# Patient Record
Sex: Female | Born: 1950 | Race: Black or African American | Hispanic: No | Marital: Single | Smoking: Never smoker
Health system: Southern US, Community
[De-identification: ages and names within clinical notes are randomized; demographics above are authoritative.]

## PROBLEM LIST (undated history)

## (undated) DIAGNOSIS — K432 Incisional hernia without obstruction or gangrene: Secondary | ICD-10-CM

## (undated) DIAGNOSIS — K449 Diaphragmatic hernia without obstruction or gangrene: Secondary | ICD-10-CM

## (undated) DIAGNOSIS — I10 Essential (primary) hypertension: Secondary | ICD-10-CM

## (undated) HISTORY — PX: ABDOMINAL HYSTERECTOMY: SHX81

## (undated) HISTORY — PX: TOTAL HIP ARTHROPLASTY: SHX124

## (undated) HISTORY — PX: COLON SURGERY: SHX602

## (undated) HISTORY — PX: BILATERAL OOPHORECTOMY: SHX1221

## (undated) HISTORY — PX: DEBRIDEMENT AND CLOSURE WOUND: SHX5614

## (undated) HISTORY — PX: HERNIA REPAIR: SHX51

## (undated) HISTORY — PX: OTHER SURGICAL HISTORY: SHX169

## (undated) HISTORY — PX: PARTIAL COLECTOMY: SHX5273

## (undated) HISTORY — PX: SPINE SURGERY: SHX786

## (undated) HISTORY — PX: CHOLECYSTECTOMY: SHX55

---

## 2002-08-19 LAB — HM HEPATITIS C SCREENING LAB: HM Hepatitis Screen: NEGATIVE

## 2008-03-09 ENCOUNTER — Ambulatory Visit: Payer: Self-pay | Admitting: Internal Medicine

## 2008-11-17 ENCOUNTER — Ambulatory Visit: Payer: Self-pay | Admitting: General Surgery

## 2008-11-20 ENCOUNTER — Ambulatory Visit: Payer: Self-pay | Admitting: General Surgery

## 2008-12-10 ENCOUNTER — Emergency Department: Payer: Self-pay | Admitting: Emergency Medicine

## 2008-12-12 ENCOUNTER — Ambulatory Visit: Payer: Self-pay | Admitting: General Surgery

## 2008-12-14 ENCOUNTER — Ambulatory Visit: Payer: Self-pay | Admitting: General Surgery

## 2008-12-18 ENCOUNTER — Ambulatory Visit: Payer: Self-pay | Admitting: General Surgery

## 2009-03-27 ENCOUNTER — Ambulatory Visit: Payer: Self-pay | Admitting: Internal Medicine

## 2009-06-06 ENCOUNTER — Ambulatory Visit: Payer: Self-pay

## 2010-02-14 ENCOUNTER — Ambulatory Visit: Payer: Self-pay | Admitting: Internal Medicine

## 2010-04-01 ENCOUNTER — Ambulatory Visit: Payer: Self-pay | Admitting: Internal Medicine

## 2011-04-11 ENCOUNTER — Ambulatory Visit: Payer: Self-pay | Admitting: Internal Medicine

## 2012-04-27 ENCOUNTER — Ambulatory Visit: Payer: Self-pay | Admitting: Internal Medicine

## 2012-05-11 ENCOUNTER — Ambulatory Visit: Payer: Self-pay | Admitting: Internal Medicine

## 2012-07-08 DIAGNOSIS — Z905 Acquired absence of kidney: Secondary | ICD-10-CM | POA: Insufficient documentation

## 2012-12-23 ENCOUNTER — Ambulatory Visit: Payer: Self-pay | Admitting: Orthopedic Surgery

## 2013-10-24 ENCOUNTER — Ambulatory Visit: Payer: Self-pay

## 2013-10-28 ENCOUNTER — Ambulatory Visit: Payer: Self-pay

## 2014-04-28 ENCOUNTER — Ambulatory Visit: Payer: Self-pay

## 2014-08-16 ENCOUNTER — Ambulatory Visit: Payer: Self-pay | Admitting: General Practice

## 2014-09-27 ENCOUNTER — Emergency Department: Payer: Self-pay | Admitting: Emergency Medicine

## 2014-09-27 LAB — URINALYSIS, COMPLETE
BLOOD: NEGATIVE
Bacteria: NONE SEEN
Bilirubin,UR: NEGATIVE
Glucose,UR: NEGATIVE mg/dL (ref 0–75)
Ketone: NEGATIVE
NITRITE: NEGATIVE
PROTEIN: NEGATIVE
Ph: 5 (ref 4.5–8.0)
RBC,UR: 1 /HPF (ref 0–5)
Specific Gravity: 1.018 (ref 1.003–1.030)
WBC UR: 5 /HPF (ref 0–5)

## 2014-09-27 LAB — COMPREHENSIVE METABOLIC PANEL
ALK PHOS: 90 U/L
ANION GAP: 6 — AB (ref 7–16)
Albumin: 3.6 g/dL (ref 3.4–5.0)
BUN: 14 mg/dL (ref 7–18)
Bilirubin,Total: 0.5 mg/dL (ref 0.2–1.0)
CALCIUM: 9 mg/dL (ref 8.5–10.1)
CO2: 27 mmol/L (ref 21–32)
CREATININE: 1.13 mg/dL (ref 0.60–1.30)
Chloride: 108 mmol/L — ABNORMAL HIGH (ref 98–107)
EGFR (Non-African Amer.): 51 — ABNORMAL LOW
Glucose: 87 mg/dL (ref 65–99)
Osmolality: 281 (ref 275–301)
POTASSIUM: 3.9 mmol/L (ref 3.5–5.1)
SGOT(AST): 26 U/L (ref 15–37)
SGPT (ALT): 29 U/L
SODIUM: 141 mmol/L (ref 136–145)
Total Protein: 7.6 g/dL (ref 6.4–8.2)

## 2014-09-27 LAB — CBC WITH DIFFERENTIAL/PLATELET
BASOS PCT: 0.5 %
Basophil #: 0 10*3/uL (ref 0.0–0.1)
EOS PCT: 1.8 %
Eosinophil #: 0.2 10*3/uL (ref 0.0–0.7)
HCT: 41.1 % (ref 35.0–47.0)
HGB: 13.6 g/dL (ref 12.0–16.0)
Lymphocyte #: 2.7 10*3/uL (ref 1.0–3.6)
Lymphocyte %: 30.5 %
MCH: 30.8 pg (ref 26.0–34.0)
MCHC: 33 g/dL (ref 32.0–36.0)
MCV: 93 fL (ref 80–100)
Monocyte #: 0.6 x10 3/mm (ref 0.2–0.9)
Monocyte %: 6.8 %
NEUTROS ABS: 5.3 10*3/uL (ref 1.4–6.5)
Neutrophil %: 60.4 %
Platelet: 210 10*3/uL (ref 150–440)
RBC: 4.41 10*6/uL (ref 3.80–5.20)
RDW: 13.5 % (ref 11.5–14.5)
WBC: 8.8 10*3/uL (ref 3.6–11.0)

## 2014-09-27 LAB — LIPASE, BLOOD: LIPASE: 147 U/L (ref 73–393)

## 2014-09-27 LAB — TROPONIN I

## 2015-02-07 ENCOUNTER — Inpatient Hospital Stay (HOSPITAL_COMMUNITY): Admission: RE | Admit: 2015-02-07 | Payer: Self-pay | Source: Ambulatory Visit | Admitting: Orthopedic Surgery

## 2015-02-07 ENCOUNTER — Encounter (HOSPITAL_COMMUNITY): Admission: RE | Payer: Self-pay | Source: Ambulatory Visit

## 2015-02-07 ENCOUNTER — Ambulatory Visit
Admit: 2015-02-07 | Disposition: A | Discharge status: Home / Self Care | Payer: Self-pay | Attending: Internal Medicine | Admitting: Internal Medicine

## 2015-02-07 SURGERY — TOTAL HIP REVISION
Anesthesia: Choice | Site: Hip | Laterality: Left

## 2015-09-21 ENCOUNTER — Ambulatory Visit: Admit: 2015-09-21 | Payer: Self-pay | Admitting: Gastroenterology

## 2015-09-21 SURGERY — COLONOSCOPY WITH PROPOFOL
Anesthesia: General

## 2016-03-18 DIAGNOSIS — Z79899 Other long term (current) drug therapy: Secondary | ICD-10-CM | POA: Diagnosis not present

## 2016-03-18 DIAGNOSIS — Z1329 Encounter for screening for other suspected endocrine disorder: Secondary | ICD-10-CM | POA: Diagnosis not present

## 2016-03-18 DIAGNOSIS — E782 Mixed hyperlipidemia: Secondary | ICD-10-CM | POA: Diagnosis not present

## 2016-03-18 DIAGNOSIS — R7309 Other abnormal glucose: Secondary | ICD-10-CM | POA: Diagnosis not present

## 2016-03-18 DIAGNOSIS — I10 Essential (primary) hypertension: Secondary | ICD-10-CM | POA: Diagnosis not present

## 2016-04-03 DIAGNOSIS — N182 Chronic kidney disease, stage 2 (mild): Secondary | ICD-10-CM | POA: Diagnosis not present

## 2016-04-03 DIAGNOSIS — I10 Essential (primary) hypertension: Secondary | ICD-10-CM | POA: Diagnosis not present

## 2016-04-03 DIAGNOSIS — Z1239 Encounter for other screening for malignant neoplasm of breast: Secondary | ICD-10-CM | POA: Diagnosis not present

## 2016-04-03 DIAGNOSIS — Z79899 Other long term (current) drug therapy: Secondary | ICD-10-CM | POA: Diagnosis not present

## 2016-04-03 DIAGNOSIS — E782 Mixed hyperlipidemia: Secondary | ICD-10-CM | POA: Diagnosis not present

## 2016-04-03 DIAGNOSIS — Z1211 Encounter for screening for malignant neoplasm of colon: Secondary | ICD-10-CM | POA: Diagnosis not present

## 2016-04-09 ENCOUNTER — Other Ambulatory Visit: Payer: Self-pay | Admitting: Internal Medicine

## 2016-04-09 DIAGNOSIS — Z1211 Encounter for screening for malignant neoplasm of colon: Secondary | ICD-10-CM | POA: Diagnosis not present

## 2016-04-09 DIAGNOSIS — Z1231 Encounter for screening mammogram for malignant neoplasm of breast: Secondary | ICD-10-CM

## 2016-04-29 ENCOUNTER — Ambulatory Visit
Admission: RE | Admit: 2016-04-29 | Discharge: 2016-04-29 | Disposition: A | Discharge status: Home / Self Care | Payer: PPO | Source: Ambulatory Visit | Attending: Internal Medicine | Admitting: Internal Medicine

## 2016-04-29 ENCOUNTER — Other Ambulatory Visit: Payer: Self-pay | Admitting: Internal Medicine

## 2016-04-29 DIAGNOSIS — Z1231 Encounter for screening mammogram for malignant neoplasm of breast: Secondary | ICD-10-CM

## 2016-09-24 DIAGNOSIS — Z79899 Other long term (current) drug therapy: Secondary | ICD-10-CM | POA: Diagnosis not present

## 2016-09-24 DIAGNOSIS — E782 Mixed hyperlipidemia: Secondary | ICD-10-CM | POA: Diagnosis not present

## 2016-09-24 DIAGNOSIS — I10 Essential (primary) hypertension: Secondary | ICD-10-CM | POA: Diagnosis not present

## 2016-10-01 DIAGNOSIS — N182 Chronic kidney disease, stage 2 (mild): Secondary | ICD-10-CM | POA: Diagnosis not present

## 2016-10-01 DIAGNOSIS — E782 Mixed hyperlipidemia: Secondary | ICD-10-CM | POA: Diagnosis not present

## 2016-10-01 DIAGNOSIS — Z79899 Other long term (current) drug therapy: Secondary | ICD-10-CM | POA: Diagnosis not present

## 2016-10-01 DIAGNOSIS — Z0000 Encounter for general adult medical examination without abnormal findings: Secondary | ICD-10-CM | POA: Diagnosis not present

## 2016-10-01 DIAGNOSIS — I10 Essential (primary) hypertension: Secondary | ICD-10-CM | POA: Diagnosis not present

## 2017-01-21 IMAGING — MG MM DIGITAL SCREENING BILAT W/ TOMO W/ CAD
8 of 13 series · 8 of 29 positions shown · non-contrast
Comparison: Previous exam(s).

CLINICAL DATA: Screening.

EXAM:
2D DIGITAL SCREENING BILATERAL MAMMOGRAM WITH CAD AND ADJUNCT TOMO

[R MLO (1 of 2)]
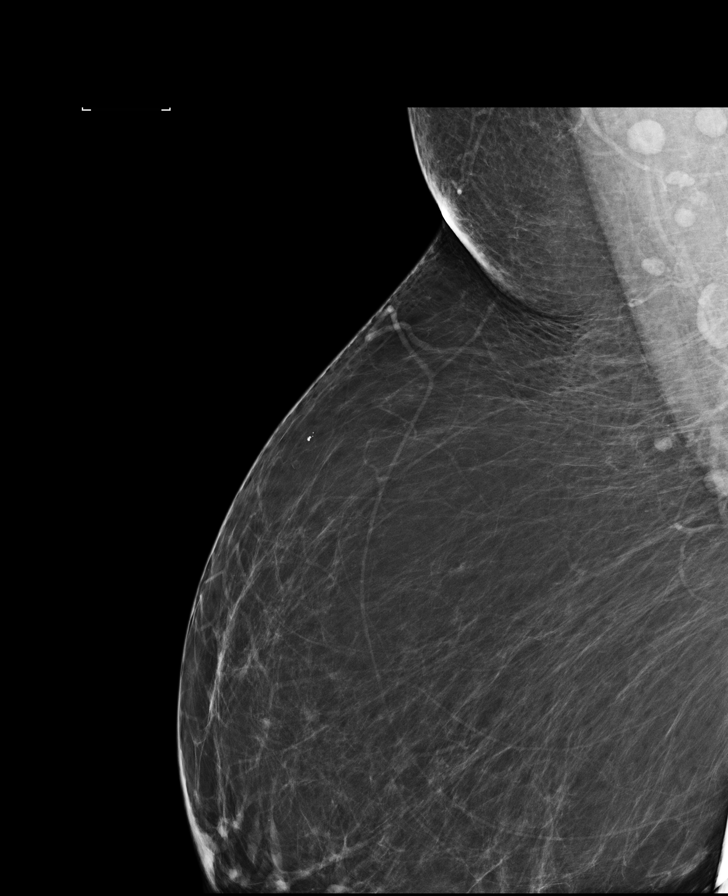

[R CC]
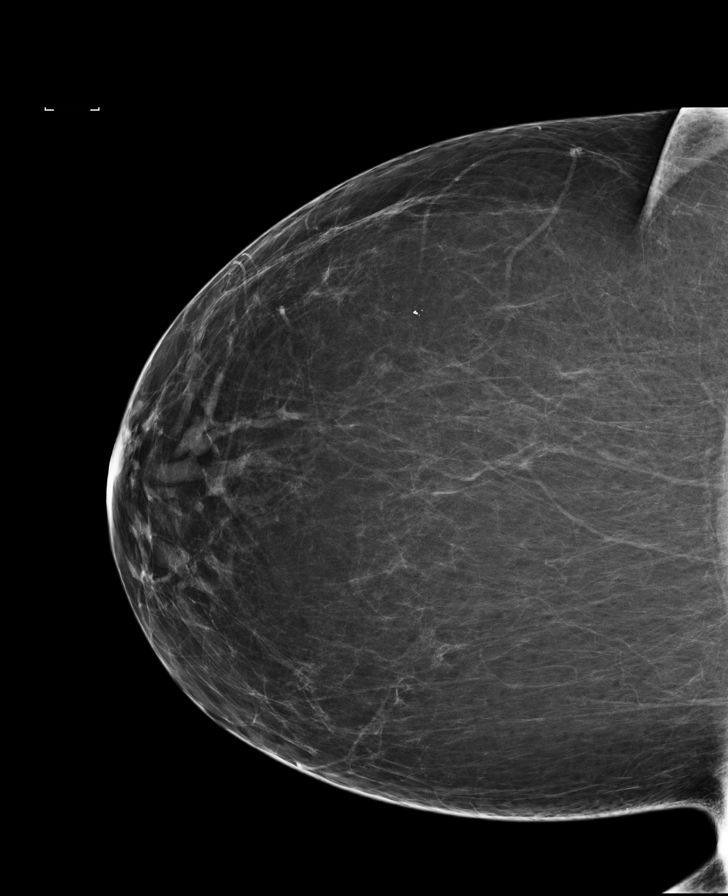

[L CC synth-2D]
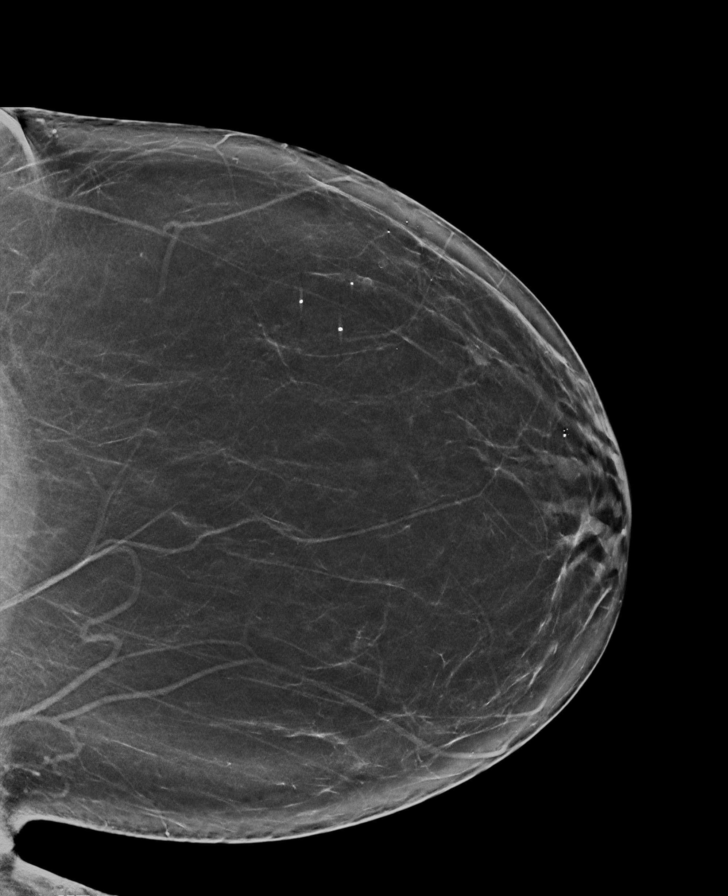

[R MLO (2 of 2)]
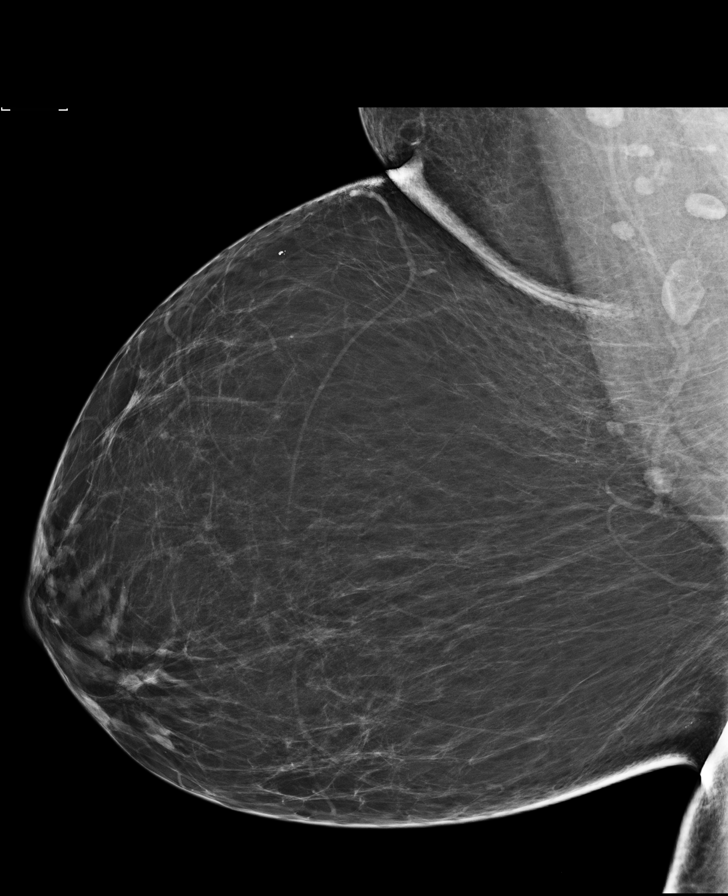

[R MLO synth-2D]
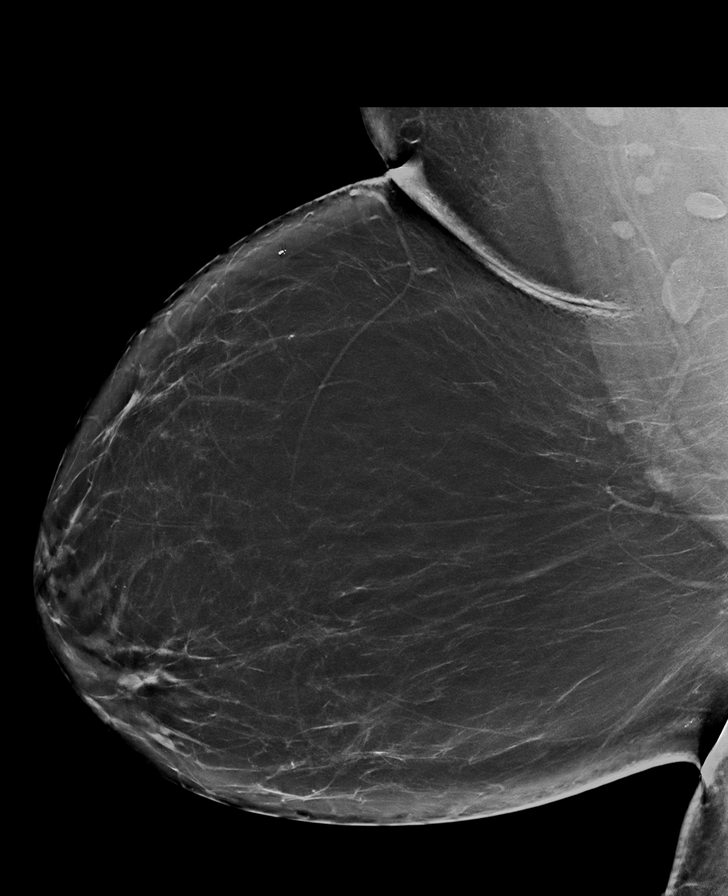

[L MLO synth-2D]
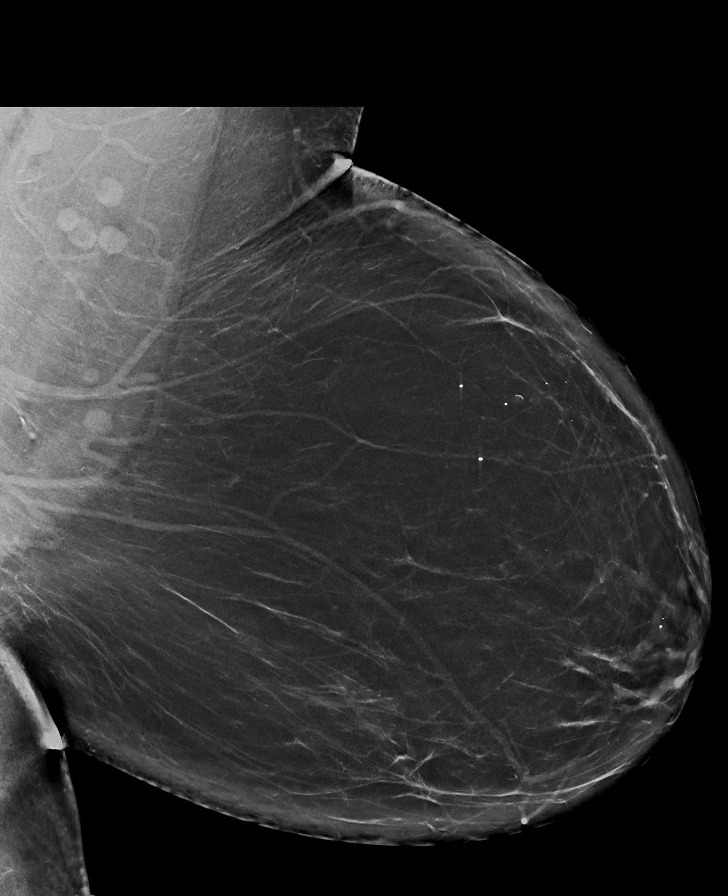

[R CC synth-2D]
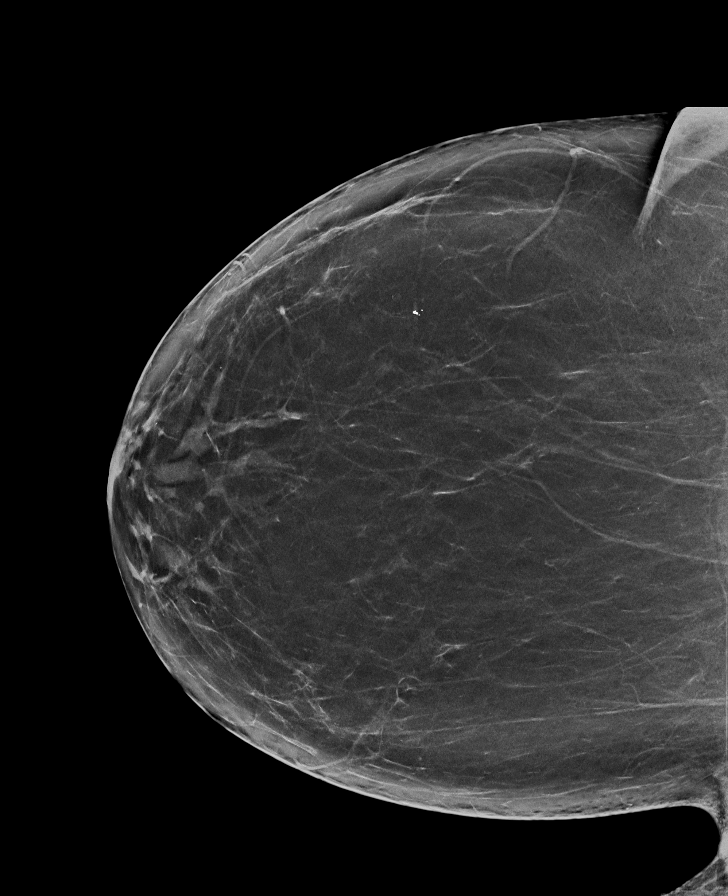

[L CC]
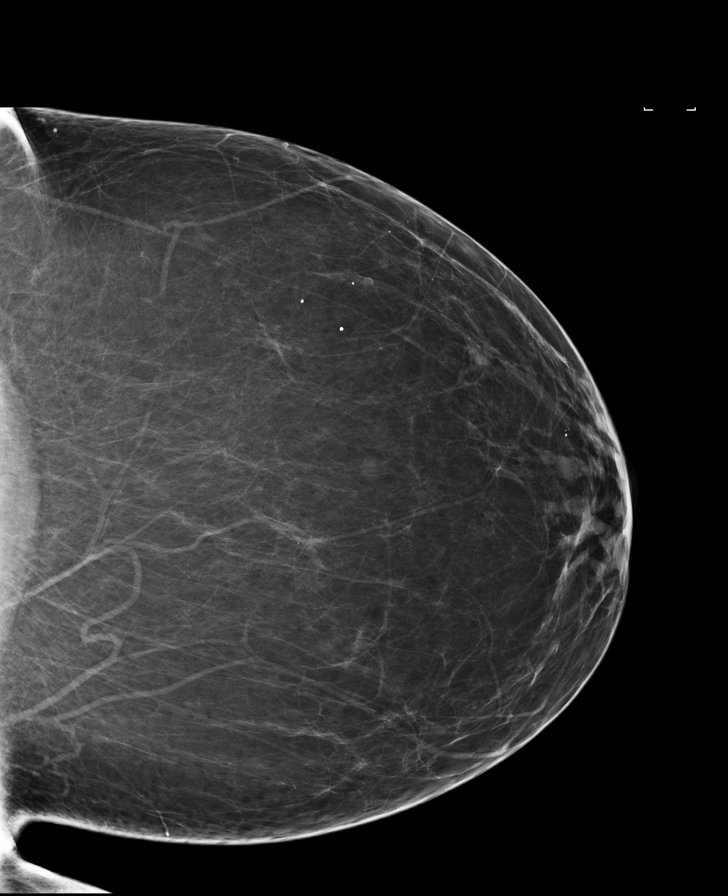

[8 of 29 positions shown; findings below may reference images not displayed]

ACR Breast Density Category b: There are scattered areas of
fibroglandular density.
FINDINGS: There are no findings suspicious for malignancy. Images were
processed with CAD.
IMPRESSION: No mammographic evidence of malignancy. A result letter of this
screening mammogram will be mailed directly to the patient.

RECOMMENDATION:
Screening mammogram in one year. (Code:97-6-RS4)

BI-RADS CATEGORY  1: Negative.

## 2017-03-23 DIAGNOSIS — I10 Essential (primary) hypertension: Secondary | ICD-10-CM | POA: Diagnosis not present

## 2017-03-23 DIAGNOSIS — Z79899 Other long term (current) drug therapy: Secondary | ICD-10-CM | POA: Diagnosis not present

## 2017-03-23 DIAGNOSIS — E782 Mixed hyperlipidemia: Secondary | ICD-10-CM | POA: Diagnosis not present

## 2017-03-25 DIAGNOSIS — M25552 Pain in left hip: Secondary | ICD-10-CM | POA: Diagnosis not present

## 2017-03-25 DIAGNOSIS — Z1231 Encounter for screening mammogram for malignant neoplasm of breast: Secondary | ICD-10-CM | POA: Diagnosis not present

## 2017-03-25 DIAGNOSIS — E782 Mixed hyperlipidemia: Secondary | ICD-10-CM | POA: Diagnosis not present

## 2017-03-25 DIAGNOSIS — N182 Chronic kidney disease, stage 2 (mild): Secondary | ICD-10-CM | POA: Diagnosis not present

## 2017-03-25 DIAGNOSIS — R002 Palpitations: Secondary | ICD-10-CM | POA: Diagnosis not present

## 2017-03-25 DIAGNOSIS — Z0000 Encounter for general adult medical examination without abnormal findings: Secondary | ICD-10-CM | POA: Diagnosis not present

## 2017-03-25 DIAGNOSIS — R079 Chest pain, unspecified: Secondary | ICD-10-CM | POA: Diagnosis not present

## 2017-03-25 DIAGNOSIS — I10 Essential (primary) hypertension: Secondary | ICD-10-CM | POA: Diagnosis not present

## 2017-03-30 ENCOUNTER — Other Ambulatory Visit: Payer: Self-pay | Admitting: Internal Medicine

## 2017-03-30 DIAGNOSIS — R079 Chest pain, unspecified: Secondary | ICD-10-CM | POA: Diagnosis not present

## 2017-03-30 DIAGNOSIS — R002 Palpitations: Secondary | ICD-10-CM | POA: Diagnosis not present

## 2017-03-30 DIAGNOSIS — Z1231 Encounter for screening mammogram for malignant neoplasm of breast: Secondary | ICD-10-CM | POA: Diagnosis not present

## 2017-03-30 DIAGNOSIS — M25552 Pain in left hip: Secondary | ICD-10-CM | POA: Diagnosis not present

## 2017-03-30 DIAGNOSIS — E782 Mixed hyperlipidemia: Secondary | ICD-10-CM | POA: Diagnosis not present

## 2017-03-30 DIAGNOSIS — I10 Essential (primary) hypertension: Secondary | ICD-10-CM | POA: Diagnosis not present

## 2017-03-30 DIAGNOSIS — N182 Chronic kidney disease, stage 2 (mild): Secondary | ICD-10-CM | POA: Diagnosis not present

## 2017-03-30 DIAGNOSIS — Z0000 Encounter for general adult medical examination without abnormal findings: Secondary | ICD-10-CM | POA: Diagnosis not present

## 2017-04-06 DIAGNOSIS — R002 Palpitations: Secondary | ICD-10-CM | POA: Diagnosis not present

## 2017-05-05 ENCOUNTER — Ambulatory Visit
Admission: RE | Admit: 2017-05-05 | Discharge: 2017-05-05 | Disposition: A | Discharge status: Home / Self Care | Payer: PPO | Source: Ambulatory Visit | Attending: Internal Medicine | Admitting: Internal Medicine

## 2017-05-05 DIAGNOSIS — Z1231 Encounter for screening mammogram for malignant neoplasm of breast: Secondary | ICD-10-CM | POA: Diagnosis not present

## 2017-07-24 DIAGNOSIS — G8929 Other chronic pain: Secondary | ICD-10-CM | POA: Diagnosis not present

## 2017-07-24 DIAGNOSIS — I10 Essential (primary) hypertension: Secondary | ICD-10-CM | POA: Diagnosis not present

## 2017-07-24 DIAGNOSIS — M25552 Pain in left hip: Secondary | ICD-10-CM | POA: Diagnosis not present

## 2017-07-24 DIAGNOSIS — E782 Mixed hyperlipidemia: Secondary | ICD-10-CM | POA: Diagnosis not present

## 2017-07-24 DIAGNOSIS — N182 Chronic kidney disease, stage 2 (mild): Secondary | ICD-10-CM | POA: Diagnosis not present

## 2017-07-24 DIAGNOSIS — Z1211 Encounter for screening for malignant neoplasm of colon: Secondary | ICD-10-CM | POA: Diagnosis not present

## 2017-07-24 DIAGNOSIS — Z79899 Other long term (current) drug therapy: Secondary | ICD-10-CM | POA: Diagnosis not present

## 2017-08-04 DIAGNOSIS — Z96642 Presence of left artificial hip joint: Secondary | ICD-10-CM | POA: Diagnosis not present

## 2017-08-04 DIAGNOSIS — M25552 Pain in left hip: Secondary | ICD-10-CM | POA: Diagnosis not present

## 2017-08-06 DIAGNOSIS — M25552 Pain in left hip: Secondary | ICD-10-CM | POA: Diagnosis not present

## 2017-08-06 DIAGNOSIS — Z1211 Encounter for screening for malignant neoplasm of colon: Secondary | ICD-10-CM | POA: Diagnosis not present

## 2017-08-06 DIAGNOSIS — Z96642 Presence of left artificial hip joint: Secondary | ICD-10-CM | POA: Diagnosis not present

## 2017-08-12 DIAGNOSIS — Z96642 Presence of left artificial hip joint: Secondary | ICD-10-CM | POA: Diagnosis not present

## 2017-08-12 DIAGNOSIS — Z01818 Encounter for other preprocedural examination: Secondary | ICD-10-CM | POA: Diagnosis not present

## 2017-08-12 DIAGNOSIS — Z01812 Encounter for preprocedural laboratory examination: Secondary | ICD-10-CM | POA: Diagnosis not present

## 2017-08-12 DIAGNOSIS — M25552 Pain in left hip: Secondary | ICD-10-CM | POA: Diagnosis not present

## 2017-08-14 DIAGNOSIS — E782 Mixed hyperlipidemia: Secondary | ICD-10-CM | POA: Diagnosis not present

## 2017-08-14 DIAGNOSIS — I10 Essential (primary) hypertension: Secondary | ICD-10-CM | POA: Diagnosis not present

## 2017-08-14 DIAGNOSIS — N182 Chronic kidney disease, stage 2 (mild): Secondary | ICD-10-CM | POA: Diagnosis not present

## 2017-08-17 DIAGNOSIS — G253 Myoclonus: Secondary | ICD-10-CM | POA: Diagnosis not present

## 2017-08-17 DIAGNOSIS — K5909 Other constipation: Secondary | ICD-10-CM | POA: Diagnosis not present

## 2017-08-17 DIAGNOSIS — R109 Unspecified abdominal pain: Secondary | ICD-10-CM | POA: Diagnosis not present

## 2017-08-17 DIAGNOSIS — M6250 Muscle wasting and atrophy, not elsewhere classified, unspecified site: Secondary | ICD-10-CM | POA: Diagnosis not present

## 2017-08-17 DIAGNOSIS — M25552 Pain in left hip: Secondary | ICD-10-CM | POA: Diagnosis not present

## 2017-08-17 DIAGNOSIS — Q600 Renal agenesis, unilateral: Secondary | ICD-10-CM | POA: Diagnosis not present

## 2017-08-17 DIAGNOSIS — R531 Weakness: Secondary | ICD-10-CM | POA: Diagnosis not present

## 2017-08-17 DIAGNOSIS — F17200 Nicotine dependence, unspecified, uncomplicated: Secondary | ICD-10-CM | POA: Diagnosis not present

## 2017-08-17 DIAGNOSIS — M6281 Muscle weakness (generalized): Secondary | ICD-10-CM | POA: Diagnosis not present

## 2017-08-17 DIAGNOSIS — N179 Acute kidney failure, unspecified: Secondary | ICD-10-CM | POA: Diagnosis not present

## 2017-08-17 DIAGNOSIS — E876 Hypokalemia: Secondary | ICD-10-CM | POA: Diagnosis not present

## 2017-08-17 DIAGNOSIS — T84061A Wear of articular bearing surface of internal prosthetic left hip joint, initial encounter: Secondary | ICD-10-CM | POA: Diagnosis not present

## 2017-08-17 DIAGNOSIS — M14852 Arthropathies in other specified diseases classified elsewhere, left hip: Secondary | ICD-10-CM | POA: Diagnosis not present

## 2017-08-17 DIAGNOSIS — Z96642 Presence of left artificial hip joint: Secondary | ICD-10-CM | POA: Diagnosis not present

## 2017-08-17 DIAGNOSIS — R110 Nausea: Secondary | ICD-10-CM | POA: Diagnosis not present

## 2017-08-17 DIAGNOSIS — N189 Chronic kidney disease, unspecified: Secondary | ICD-10-CM | POA: Diagnosis not present

## 2017-08-17 DIAGNOSIS — R51 Headache: Secondary | ICD-10-CM | POA: Diagnosis not present

## 2017-08-17 DIAGNOSIS — R2689 Other abnormalities of gait and mobility: Secondary | ICD-10-CM | POA: Diagnosis not present

## 2017-08-17 DIAGNOSIS — T8484XA Pain due to internal orthopedic prosthetic devices, implants and grafts, initial encounter: Secondary | ICD-10-CM | POA: Diagnosis not present

## 2017-08-17 DIAGNOSIS — G8911 Acute pain due to trauma: Secondary | ICD-10-CM | POA: Diagnosis not present

## 2017-08-20 DIAGNOSIS — Z96642 Presence of left artificial hip joint: Secondary | ICD-10-CM | POA: Diagnosis not present

## 2017-08-20 DIAGNOSIS — K5909 Other constipation: Secondary | ICD-10-CM | POA: Diagnosis not present

## 2017-08-20 DIAGNOSIS — R262 Difficulty in walking, not elsewhere classified: Secondary | ICD-10-CM | POA: Diagnosis not present

## 2017-08-20 DIAGNOSIS — K5900 Constipation, unspecified: Secondary | ICD-10-CM | POA: Diagnosis not present

## 2017-08-20 DIAGNOSIS — Z7982 Long term (current) use of aspirin: Secondary | ICD-10-CM | POA: Diagnosis not present

## 2017-08-20 DIAGNOSIS — M6250 Muscle wasting and atrophy, not elsewhere classified, unspecified site: Secondary | ICD-10-CM | POA: Diagnosis not present

## 2017-08-20 DIAGNOSIS — R2689 Other abnormalities of gait and mobility: Secondary | ICD-10-CM | POA: Diagnosis not present

## 2017-08-20 DIAGNOSIS — G8911 Acute pain due to trauma: Secondary | ICD-10-CM | POA: Diagnosis not present

## 2017-08-20 DIAGNOSIS — M6281 Muscle weakness (generalized): Secondary | ICD-10-CM | POA: Diagnosis not present

## 2017-08-20 DIAGNOSIS — D649 Anemia, unspecified: Secondary | ICD-10-CM | POA: Diagnosis not present

## 2017-08-20 DIAGNOSIS — R531 Weakness: Secondary | ICD-10-CM | POA: Diagnosis not present

## 2017-08-20 DIAGNOSIS — M14852 Arthropathies in other specified diseases classified elsewhere, left hip: Secondary | ICD-10-CM | POA: Diagnosis not present

## 2017-08-23 DIAGNOSIS — K5900 Constipation, unspecified: Secondary | ICD-10-CM | POA: Diagnosis not present

## 2017-08-23 DIAGNOSIS — R262 Difficulty in walking, not elsewhere classified: Secondary | ICD-10-CM | POA: Diagnosis not present

## 2017-08-23 DIAGNOSIS — Z96642 Presence of left artificial hip joint: Secondary | ICD-10-CM | POA: Diagnosis not present

## 2017-08-23 DIAGNOSIS — Z7982 Long term (current) use of aspirin: Secondary | ICD-10-CM | POA: Diagnosis not present

## 2017-08-28 DIAGNOSIS — K5900 Constipation, unspecified: Secondary | ICD-10-CM | POA: Diagnosis not present

## 2017-08-28 DIAGNOSIS — Z7982 Long term (current) use of aspirin: Secondary | ICD-10-CM | POA: Diagnosis not present

## 2017-08-28 DIAGNOSIS — D649 Anemia, unspecified: Secondary | ICD-10-CM | POA: Diagnosis not present

## 2017-08-28 DIAGNOSIS — Z96642 Presence of left artificial hip joint: Secondary | ICD-10-CM | POA: Diagnosis not present

## 2017-08-31 ENCOUNTER — Other Ambulatory Visit: Payer: Self-pay | Admitting: *Deleted

## 2017-08-31 DIAGNOSIS — R2689 Other abnormalities of gait and mobility: Secondary | ICD-10-CM | POA: Diagnosis not present

## 2017-08-31 DIAGNOSIS — Z96641 Presence of right artificial hip joint: Secondary | ICD-10-CM | POA: Diagnosis not present

## 2017-08-31 DIAGNOSIS — Z4789 Encounter for other orthopedic aftercare: Secondary | ICD-10-CM | POA: Diagnosis not present

## 2017-08-31 DIAGNOSIS — Z96649 Presence of unspecified artificial hip joint: Secondary | ICD-10-CM | POA: Diagnosis not present

## 2017-08-31 DIAGNOSIS — M25552 Pain in left hip: Secondary | ICD-10-CM | POA: Diagnosis not present

## 2017-08-31 NOTE — Patient Outreach (Signed)
[facility]) Care Management  08/31/2017  Philis KendallMarsha A Messer 09-30-1951 [id number]   Transition of Care Referral  Referral Date: 08/31/17 Referral Source: HTA Date of Discharge: 08/28/17 Facility: [facility] Discharge Diagnosis: N/A Insurance: HTA  Outreach attempt #1 to patient. Patient was unavailable. She requested a return phone call at a later time.   Plan: RN CM will contact patient within one week.   Wynelle ClevelandJuanita Friend Dorfman, RN, BSN, MHA/MSL, [facility] Telephonic Care Manager Coordinator [facility] Direct Phone: [phone] Toll Free: [phone] Fax: [phone]

## 2017-09-01 ENCOUNTER — Other Ambulatory Visit: Payer: Self-pay | Admitting: *Deleted

## 2017-09-01 NOTE — Patient Outreach (Signed)
[facility]) Care Management  09/01/2017  Carmen Washington 06/15/1951 [id number]  Transition of Care Referral  Referral Date: 08/31/17 Referral Source: HTA Date of Discharge: 08/28/17 Facility: [facility] Discharge Diagnosis: Weakness r/t Hip Revision Insurance: HTA  Outreach telephone call to patient. Patient verified HIPAA identifiers. Patient was transitioned from inpatient hospital stay to [facility] in [place] due to a left hip revision. She had an appointment on 08/31/17 with the Orthopedic Surgeon. The Surgeon prescribed outpatient PT for patient at [facility] in [place]. She had an initial visit at [facility] on 08/31/17. Patient stated, she was given a cane during the initial visit. Patient's future appointments will be scheduled 2 times per week (Wednesday & Friday). Patient reported, "She has an appointment scheduled for next month with the Orthopedic Surgeon to determine the need for a right hip revision". [facility] services and benefits explained to patient. Patient is willing to receive EMMI educational materials, [facility] broch[facility] and pamphlet.   Plan: RN CM will notify [facility] CM administrative assistant regarding case closure.  RN CM advised patient to contact RNCM for any needs or concerns. RN CM will send patient EMMI educational materials. RN CM will send patient [facility] brochure and pamphlet. RN CM will send patient case closure letter. RN CM will send MD case closure letter.    Wynelle ClevelandJuanita Gustabo Gordillo, RN, BSN, MHA/MSL, [facility] Telephonic Care Manager Coordinator [facility] Direct Phone: [phone] Toll Free: [phone] Fax: [phone]

## 2017-09-02 ENCOUNTER — Ambulatory Visit: Payer: PPO | Admitting: *Deleted

## 2017-09-02 ENCOUNTER — Encounter: Payer: Self-pay | Admitting: *Deleted

## 2017-09-02 DIAGNOSIS — M25552 Pain in left hip: Secondary | ICD-10-CM | POA: Diagnosis not present

## 2017-09-02 DIAGNOSIS — R2689 Other abnormalities of gait and mobility: Secondary | ICD-10-CM | POA: Diagnosis not present

## 2017-09-02 DIAGNOSIS — Z96642 Presence of left artificial hip joint: Secondary | ICD-10-CM | POA: Diagnosis not present

## 2017-09-09 DIAGNOSIS — Z96642 Presence of left artificial hip joint: Secondary | ICD-10-CM | POA: Diagnosis not present

## 2017-09-09 DIAGNOSIS — R2689 Other abnormalities of gait and mobility: Secondary | ICD-10-CM | POA: Diagnosis not present

## 2017-09-09 DIAGNOSIS — M25552 Pain in left hip: Secondary | ICD-10-CM | POA: Diagnosis not present

## 2017-09-11 DIAGNOSIS — M25552 Pain in left hip: Secondary | ICD-10-CM | POA: Diagnosis not present

## 2017-09-11 DIAGNOSIS — Z96642 Presence of left artificial hip joint: Secondary | ICD-10-CM | POA: Diagnosis not present

## 2017-09-11 DIAGNOSIS — R2689 Other abnormalities of gait and mobility: Secondary | ICD-10-CM | POA: Diagnosis not present

## 2017-09-15 DIAGNOSIS — M25552 Pain in left hip: Secondary | ICD-10-CM | POA: Diagnosis not present

## 2017-09-15 DIAGNOSIS — R2689 Other abnormalities of gait and mobility: Secondary | ICD-10-CM | POA: Diagnosis not present

## 2017-09-15 DIAGNOSIS — Z96642 Presence of left artificial hip joint: Secondary | ICD-10-CM | POA: Diagnosis not present

## 2017-09-18 DIAGNOSIS — R2689 Other abnormalities of gait and mobility: Secondary | ICD-10-CM | POA: Diagnosis not present

## 2017-09-18 DIAGNOSIS — M25552 Pain in left hip: Secondary | ICD-10-CM | POA: Diagnosis not present

## 2017-09-18 DIAGNOSIS — Z96642 Presence of left artificial hip joint: Secondary | ICD-10-CM | POA: Diagnosis not present

## 2017-09-24 DIAGNOSIS — M25552 Pain in left hip: Secondary | ICD-10-CM | POA: Diagnosis not present

## 2017-09-24 DIAGNOSIS — R2689 Other abnormalities of gait and mobility: Secondary | ICD-10-CM | POA: Diagnosis not present

## 2017-09-24 DIAGNOSIS — Z96642 Presence of left artificial hip joint: Secondary | ICD-10-CM | POA: Diagnosis not present

## 2017-09-29 DIAGNOSIS — R2689 Other abnormalities of gait and mobility: Secondary | ICD-10-CM | POA: Diagnosis not present

## 2017-09-29 DIAGNOSIS — M25552 Pain in left hip: Secondary | ICD-10-CM | POA: Diagnosis not present

## 2017-09-29 DIAGNOSIS — Z96649 Presence of unspecified artificial hip joint: Secondary | ICD-10-CM | POA: Diagnosis not present

## 2017-10-01 DIAGNOSIS — M25551 Pain in right hip: Secondary | ICD-10-CM | POA: Diagnosis not present

## 2017-10-01 DIAGNOSIS — M25552 Pain in left hip: Secondary | ICD-10-CM | POA: Diagnosis not present

## 2017-10-01 DIAGNOSIS — Z96641 Presence of right artificial hip joint: Secondary | ICD-10-CM | POA: Diagnosis not present

## 2017-10-16 DIAGNOSIS — Z01818 Encounter for other preprocedural examination: Secondary | ICD-10-CM | POA: Diagnosis not present

## 2017-10-16 DIAGNOSIS — Z96641 Presence of right artificial hip joint: Secondary | ICD-10-CM | POA: Diagnosis not present

## 2017-10-16 DIAGNOSIS — M25551 Pain in right hip: Secondary | ICD-10-CM | POA: Diagnosis not present

## 2017-10-28 DIAGNOSIS — M25551 Pain in right hip: Secondary | ICD-10-CM | POA: Diagnosis not present

## 2017-10-28 DIAGNOSIS — G8929 Other chronic pain: Secondary | ICD-10-CM | POA: Diagnosis not present

## 2017-10-28 DIAGNOSIS — Z96641 Presence of right artificial hip joint: Secondary | ICD-10-CM | POA: Diagnosis not present

## 2017-10-28 DIAGNOSIS — T8484XA Pain due to internal orthopedic prosthetic devices, implants and grafts, initial encounter: Secondary | ICD-10-CM | POA: Diagnosis not present

## 2017-10-28 DIAGNOSIS — R070 Pain in throat: Secondary | ICD-10-CM | POA: Diagnosis not present

## 2017-11-02 DIAGNOSIS — M25551 Pain in right hip: Secondary | ICD-10-CM | POA: Diagnosis not present

## 2017-11-02 DIAGNOSIS — M25651 Stiffness of right hip, not elsewhere classified: Secondary | ICD-10-CM | POA: Diagnosis not present

## 2017-11-03 ENCOUNTER — Other Ambulatory Visit: Payer: Self-pay

## 2017-11-03 NOTE — Patient Outreach (Signed)
[facility]) Care Management  11/03/2017  Philis KendallMarsha A Coca 1951/08/30 [id number]   Telephone call to patient for transition of care assessment.  No answer.  HIPAA compliant voice message left.  Plan: RN CM will attempt patient again within 3 business days.  Bary Lericheionne J Esequiel Kleinfelter, RN, MSN [facility] Care Management Care Management Coordinator Direct Line [phone] Toll Free: [phone]  Fax: [phone]

## 2017-11-04 ENCOUNTER — Other Ambulatory Visit: Payer: Self-pay

## 2017-11-04 NOTE — Patient Outreach (Signed)
[facility]) Care Management  11/04/2017  Philis KendallMarsha A Turcott [birth year] [id number]   Referral Date: 11/03/17 Referral Source: HTA Date of Admission: 10/28/17 Diagnosis: Right hip replacement Date of Discharge: 10/30/17 [facility] Insurance:  HTA  Outreach attempt # 2 spoke with patient.  She is able to verify HIPAA.  Patient reports she went in for a right hip replacement this time and that she had left hip done previously.  She states she is doing ok. She states she is doing outpatient physical therapy and has transportation but states that sometimes transportation is limited.  Offered patient transportation resources but patient declined. Patient reports that her sister is assisting since being discharged.    Medications: Patient able to review her medications and has no questions.    Appointments: Patient reports that she has a follow up appointment with the surgeon on 11-11-17.    Consent: RN CM reviewed [facility] services with patient. Patient declined services at this time but agreed for letter and brochure.    Plan: RN CM will send letter and brochure. RN CM will close case and notify care management assistant of case status.   Bary Lericheionne J Palyn Scrima, RN, MSN [facility] Care Management Care Management Coordinator Direct Line [phone] Toll Free: [phone]  Fax: [phone]

## 2017-11-06 DIAGNOSIS — M25651 Stiffness of right hip, not elsewhere classified: Secondary | ICD-10-CM | POA: Diagnosis not present

## 2017-11-06 DIAGNOSIS — M25551 Pain in right hip: Secondary | ICD-10-CM | POA: Diagnosis not present

## 2017-11-11 DIAGNOSIS — Z96641 Presence of right artificial hip joint: Secondary | ICD-10-CM | POA: Diagnosis not present

## 2017-11-11 DIAGNOSIS — Z4789 Encounter for other orthopedic aftercare: Secondary | ICD-10-CM | POA: Diagnosis not present

## 2018-09-28 ENCOUNTER — Other Ambulatory Visit: Payer: Self-pay | Admitting: Internal Medicine

## 2018-09-28 DIAGNOSIS — Z1231 Encounter for screening mammogram for malignant neoplasm of breast: Secondary | ICD-10-CM

## 2018-09-28 DIAGNOSIS — E782 Mixed hyperlipidemia: Secondary | ICD-10-CM | POA: Diagnosis not present

## 2018-09-28 DIAGNOSIS — I10 Essential (primary) hypertension: Secondary | ICD-10-CM | POA: Diagnosis not present

## 2018-09-28 DIAGNOSIS — Z1211 Encounter for screening for malignant neoplasm of colon: Secondary | ICD-10-CM | POA: Diagnosis not present

## 2018-09-28 DIAGNOSIS — N182 Chronic kidney disease, stage 2 (mild): Secondary | ICD-10-CM | POA: Diagnosis not present

## 2018-09-28 DIAGNOSIS — Z79899 Other long term (current) drug therapy: Secondary | ICD-10-CM | POA: Diagnosis not present

## 2018-09-28 DIAGNOSIS — M25552 Pain in left hip: Secondary | ICD-10-CM | POA: Diagnosis not present

## 2018-09-28 DIAGNOSIS — M25551 Pain in right hip: Secondary | ICD-10-CM | POA: Diagnosis not present

## 2018-09-28 DIAGNOSIS — Z1239 Encounter for other screening for malignant neoplasm of breast: Secondary | ICD-10-CM | POA: Diagnosis not present

## 2018-10-07 ENCOUNTER — Ambulatory Visit
Admission: RE | Admit: 2018-10-07 | Discharge: 2018-10-07 | Disposition: A | Discharge status: Home / Self Care | Payer: PPO | Source: Ambulatory Visit | Attending: Internal Medicine | Admitting: Internal Medicine

## 2018-10-07 DIAGNOSIS — Z1231 Encounter for screening mammogram for malignant neoplasm of breast: Secondary | ICD-10-CM | POA: Insufficient documentation

## 2019-10-25 ENCOUNTER — Emergency Department: Payer: PPO

## 2019-10-25 ENCOUNTER — Observation Stay: Payer: PPO

## 2019-10-25 ENCOUNTER — Inpatient Hospital Stay
Admission: EM | Admit: 2019-10-25 | Discharge: 2019-10-27 | DRG: 390 | Disposition: A | Discharge status: Home / Self Care | Payer: PPO | Attending: Family Medicine | Admitting: Family Medicine

## 2019-10-25 ENCOUNTER — Other Ambulatory Visit: Payer: Self-pay

## 2019-10-25 DIAGNOSIS — K5651 Intestinal adhesions [bands], with partial obstruction: Secondary | ICD-10-CM | POA: Diagnosis not present

## 2019-10-25 DIAGNOSIS — N1831 Chronic kidney disease, stage 3a: Secondary | ICD-10-CM | POA: Diagnosis present

## 2019-10-25 DIAGNOSIS — Z881 Allergy status to other antibiotic agents status: Secondary | ICD-10-CM

## 2019-10-25 DIAGNOSIS — K56600 Partial intestinal obstruction, unspecified as to cause: Secondary | ICD-10-CM | POA: Diagnosis not present

## 2019-10-25 DIAGNOSIS — Z03818 Encounter for observation for suspected exposure to other biological agents ruled out: Secondary | ICD-10-CM | POA: Diagnosis not present

## 2019-10-25 DIAGNOSIS — E785 Hyperlipidemia, unspecified: Secondary | ICD-10-CM | POA: Diagnosis present

## 2019-10-25 DIAGNOSIS — Z96649 Presence of unspecified artificial hip joint: Secondary | ICD-10-CM | POA: Diagnosis present

## 2019-10-25 DIAGNOSIS — Z882 Allergy status to sulfonamides status: Secondary | ICD-10-CM | POA: Diagnosis not present

## 2019-10-25 DIAGNOSIS — Z20822 Contact with and (suspected) exposure to covid-19: Secondary | ICD-10-CM | POA: Diagnosis present

## 2019-10-25 DIAGNOSIS — K56609 Unspecified intestinal obstruction, unspecified as to partial versus complete obstruction: Secondary | ICD-10-CM | POA: Diagnosis not present

## 2019-10-25 DIAGNOSIS — Z7982 Long term (current) use of aspirin: Secondary | ICD-10-CM

## 2019-10-25 DIAGNOSIS — R109 Unspecified abdominal pain: Secondary | ICD-10-CM

## 2019-10-25 DIAGNOSIS — I10 Essential (primary) hypertension: Secondary | ICD-10-CM

## 2019-10-25 DIAGNOSIS — K5730 Diverticulosis of large intestine without perforation or abscess without bleeding: Secondary | ICD-10-CM | POA: Diagnosis not present

## 2019-10-25 DIAGNOSIS — I129 Hypertensive chronic kidney disease with stage 1 through stage 4 chronic kidney disease, or unspecified chronic kidney disease: Secondary | ICD-10-CM | POA: Diagnosis not present

## 2019-10-25 DIAGNOSIS — K439 Ventral hernia without obstruction or gangrene: Secondary | ICD-10-CM | POA: Diagnosis present

## 2019-10-25 DIAGNOSIS — N183 Chronic kidney disease, stage 3 unspecified: Secondary | ICD-10-CM

## 2019-10-25 DIAGNOSIS — R1031 Right lower quadrant pain: Secondary | ICD-10-CM | POA: Diagnosis not present

## 2019-10-25 DIAGNOSIS — E1122 Type 2 diabetes mellitus with diabetic chronic kidney disease: Secondary | ICD-10-CM | POA: Diagnosis present

## 2019-10-25 DIAGNOSIS — Z905 Acquired absence of kidney: Secondary | ICD-10-CM | POA: Diagnosis not present

## 2019-10-25 DIAGNOSIS — Z4682 Encounter for fitting and adjustment of non-vascular catheter: Secondary | ICD-10-CM | POA: Diagnosis not present

## 2019-10-25 DIAGNOSIS — Z6838 Body mass index (BMI) 38.0-38.9, adult: Secondary | ICD-10-CM | POA: Diagnosis not present

## 2019-10-25 DIAGNOSIS — E669 Obesity, unspecified: Secondary | ICD-10-CM | POA: Diagnosis not present

## 2019-10-25 DIAGNOSIS — Z9889 Other specified postprocedural states: Secondary | ICD-10-CM

## 2019-10-25 DIAGNOSIS — Z0189 Encounter for other specified special examinations: Secondary | ICD-10-CM

## 2019-10-25 HISTORY — DX: Partial intestinal obstruction, unspecified as to cause: K56.600

## 2019-10-25 HISTORY — DX: Unspecified intestinal obstruction, unspecified as to partial versus complete obstruction: K56.609

## 2019-10-25 HISTORY — DX: Essential (primary) hypertension: I10

## 2019-10-25 HISTORY — DX: Incisional hernia without obstruction or gangrene: K43.2

## 2019-10-25 HISTORY — DX: Diaphragmatic hernia without obstruction or gangrene: K44.9

## 2019-10-25 LAB — RESPIRATORY PANEL BY RT PCR (FLU A&B, COVID)
Influenza A by PCR: NEGATIVE
Influenza B by PCR: NEGATIVE
SARS Coronavirus 2 by RT PCR: NEGATIVE

## 2019-10-25 LAB — CBC WITH DIFFERENTIAL/PLATELET
Abs Immature Granulocytes: 0.03 10*3/uL (ref 0.00–0.07)
Basophils Absolute: 0 10*3/uL (ref 0.0–0.1)
Basophils Relative: 0 %
Eosinophils Absolute: 0.2 10*3/uL (ref 0.0–0.5)
Eosinophils Relative: 2 %
HCT: 44.6 % (ref 36.0–46.0)
Hemoglobin: 14.6 g/dL (ref 12.0–15.0)
Immature Granulocytes: 0 %
Lymphocytes Relative: 23 %
Lymphs Abs: 2.6 10*3/uL (ref 0.7–4.0)
MCH: 30.5 pg (ref 26.0–34.0)
MCHC: 32.7 g/dL (ref 30.0–36.0)
MCV: 93.3 fL (ref 80.0–100.0)
Monocytes Absolute: 0.7 10*3/uL (ref 0.1–1.0)
Monocytes Relative: 6 %
Neutro Abs: 7.8 10*3/uL — ABNORMAL HIGH (ref 1.7–7.7)
Neutrophils Relative %: 69 %
Platelets: 220 10*3/uL (ref 150–400)
RBC: 4.78 MIL/uL (ref 3.87–5.11)
RDW: 12.9 % (ref 11.5–15.5)
WBC: 11.2 10*3/uL — ABNORMAL HIGH (ref 4.0–10.5)
nRBC: 0 % (ref 0.0–0.2)

## 2019-10-25 LAB — HM HIV SCREENING LAB: HM HIV Screening: NEGATIVE

## 2019-10-25 LAB — HIV ANTIBODY (ROUTINE TESTING W REFLEX): HIV Screen 4th Generation wRfx: NONREACTIVE

## 2019-10-25 LAB — COMPREHENSIVE METABOLIC PANEL
ALT: 20 U/L (ref 0–44)
AST: 19 U/L (ref 15–41)
Albumin: 4.4 g/dL (ref 3.5–5.0)
Alkaline Phosphatase: 70 U/L (ref 38–126)
Anion gap: 9 (ref 5–15)
BUN: 17 mg/dL (ref 8–23)
CO2: 29 mmol/L (ref 22–32)
Calcium: 9.6 mg/dL (ref 8.9–10.3)
Chloride: 104 mmol/L (ref 98–111)
Creatinine, Ser: 1.27 mg/dL — ABNORMAL HIGH (ref 0.44–1.00)
GFR calc Af Amer: 50 mL/min — ABNORMAL LOW (ref >60)
GFR calc non Af Amer: 43 mL/min — ABNORMAL LOW (ref >60)
Glucose, Bld: 110 mg/dL — ABNORMAL HIGH (ref 70–99)
Potassium: 3.9 mmol/L (ref 3.5–5.1)
Sodium: 142 mmol/L (ref 135–145)
Total Bilirubin: 0.9 mg/dL (ref 0.3–1.2)
Total Protein: 7.8 g/dL (ref 6.5–8.1)

## 2019-10-25 LAB — LIPASE, BLOOD: Lipase: 30 U/L (ref 11–51)

## 2019-10-25 LAB — TROPONIN I (HIGH SENSITIVITY): Troponin I (High Sensitivity): 3 ng/L (ref <18)

## 2019-10-25 MED ORDER — ONDANSETRON HCL 4 MG/2ML IJ SOLN
4.0000 mg | Freq: Four times a day (QID) | INTRAMUSCULAR | Status: DC | PRN
  Administered 2019-10-25 (×2): 4 mg via INTRAVENOUS
  Filled 2019-10-25 (×2): qty 2

## 2019-10-25 MED ORDER — POTASSIUM CHLORIDE IN NACL 20-0.45 MEQ/L-% IV SOLN
INTRAVENOUS | Status: DC
  Filled 2019-10-25 (×8): qty 1000

## 2019-10-25 MED ORDER — DIATRIZOATE MEGLUMINE & SODIUM 66-10 % PO SOLN
90.0000 mL | Freq: Once | ORAL | Status: AC
  Administered 2019-10-25: 22:00:00 90 mL via NASOGASTRIC

## 2019-10-25 MED ORDER — ONDANSETRON HCL 4 MG/2ML IJ SOLN
4.0000 mg | Freq: Once | INTRAMUSCULAR | Status: AC
  Administered 2019-10-25: 4 mg via INTRAVENOUS
  Filled 2019-10-25: qty 2

## 2019-10-25 MED ORDER — MORPHINE SULFATE (PF) 4 MG/ML IV SOLN
4.0000 mg | Freq: Once | INTRAVENOUS | Status: AC
  Administered 2019-10-25: 4 mg via INTRAVENOUS
  Filled 2019-10-25: qty 1

## 2019-10-25 MED ORDER — ACETAMINOPHEN 650 MG RE SUPP
650.0000 mg | Freq: Four times a day (QID) | RECTAL | Status: DC | PRN
  Administered 2019-10-26: 650 mg via RECTAL
  Filled 2019-10-25: qty 1

## 2019-10-25 MED ORDER — ENOXAPARIN SODIUM 40 MG/0.4ML ~~LOC~~ SOLN
40.0000 mg | SUBCUTANEOUS | Status: DC
  Administered 2019-10-25 – 2019-10-27 (×3): 40 mg via SUBCUTANEOUS
  Filled 2019-10-25 (×3): qty 0.4

## 2019-10-25 MED ORDER — ACETAMINOPHEN 325 MG PO TABS
650.0000 mg | ORAL_TABLET | Freq: Four times a day (QID) | ORAL | Status: DC | PRN
  Administered 2019-10-27: 650 mg via ORAL
  Filled 2019-10-25: qty 2

## 2019-10-25 MED ORDER — ONDANSETRON HCL 4 MG PO TABS: 4.0000 mg | ORAL_TABLET | Freq: Four times a day (QID) | ORAL | Status: DC | PRN

## 2019-10-25 MED ORDER — MORPHINE SULFATE (PF) 2 MG/ML IV SOLN
2.0000 mg | INTRAVENOUS | Status: DC | PRN
  Administered 2019-10-25 (×4): 2 mg via INTRAVENOUS
  Filled 2019-10-25 (×4): qty 1

## 2019-10-25 MED ORDER — SODIUM CHLORIDE 0.9 % IV BOLUS
1000.0000 mL | Freq: Once | INTRAVENOUS | Status: AC
  Administered 2019-10-25: 1000 mL via INTRAVENOUS

## 2019-10-25 MED ORDER — FENTANYL CITRATE (PF) 100 MCG/2ML IJ SOLN
50.0000 ug | Freq: Once | INTRAMUSCULAR | Status: AC
  Administered 2019-10-25: 50 ug via INTRAVENOUS
  Filled 2019-10-25: qty 2

## 2019-10-25 NOTE — ED Notes (Signed)
Pt unable to give a urine specimen at this time. Pt transported to CT.

## 2019-10-25 NOTE — ED Triage Notes (Signed)
Pt to the er from home with abd pain 10/10, hx of 2 abd hernias, hx of abd surgery, pt has a hx of bowel knick, tetracycline and codeine allergies, nausea and vomiting at home. no vomiting on truck, possible syncopal episode 99.7 temp, 80 hr 98% ra, 140/79 with EMS

## 2019-10-25 NOTE — Progress Notes (Signed)
PROGRESS NOTE    Carmen Washington  TKZ:[id number] DOB: [birth year] DOA: 10/25/2019 PCP: Carmen Arbour, MD      Brief Narrative:  Carmen Washington is a 69 y.o. F with obesity, DM, CKD 3, and multiple abdominal surgeries, most recently in 2012 who presented with acute sharp right lower quadrant pain radiating to the back with vomiting.  In the ER, the patient vomited several times, creatinine 1.27, at baseline, WBC 11 K, CT abdomen pelvis showed small bowel obstruction.  NG tube was placed, and the hospitalist service were asked to evaluate for SBO.        Assessment & Plan:  Small bowel obstruction This persists, she is still tender. -Maintain NG to LIS -N.p.o., ice chips -Continue maintenance IV fluids -Daily BMP while on fluids -Fentanyl for pain -Gastrografin small bowel obstruction protocol with serial radiographs -Appreciate general surgery input   CKD stage IIIa Creatinine stable relative to baseline    DVT prophylaxis: Lovenox Code Status: Full code Family Communication: Sister by phone  MDM and disposition Plan: This is a no charge note.  For further details, please see H&P by my partner Carmen Washington from earlier today.  The below labs and imaging reports were reviewed and summarized above.    The patient was admitted with small bowel obstruction  The patient, the patient has a small bowel obstruction, requires ongoing IV fluids, and maintenance of NG tube.   If SBO protocol shows passage of contrast tomorrow, she can have her tube clamped tomorrow and diet advanced with best case discharge in 2 days.     Objective: Vitals:   10/25/19 0830 10/25/19 1515 10/25/19 1529 10/25/19 1616  BP: (!) 140/98  (!) 106/55 115/61  Pulse: 75   74  Resp: 16 16    Temp: 98 F (36.7 C)   98 F (36.7 C)  TempSrc: Oral   Oral  SpO2: 99%   100%  Weight:      Height:        Intake/Output Summary (Last 24 hours) at 10/25/2019 1757 Last data filed at 10/25/2019  1500 Gross per 24 hour  Intake --  Output 50 ml  Net -50 ml   Filed Weights   10/25/19 0213  Weight: 111.1 kg    Examination: The patient was seen and examined.      Data Reviewed: I have personally reviewed following labs and imaging studies:  CBC: Recent Labs  Lab 10/25/19 0221  WBC 11.2*  NEUTROABS 7.8*  HGB 14.6  HCT 44.6  MCV 93.3  PLT 220   Basic Metabolic Panel: Recent Labs  Lab 10/25/19 0221  NA 142  K 3.9  CL 104  CO2 29  GLUCOSE 110*  BUN 17  CREATININE 1.27*  CALCIUM 9.6   GFR: Estimated Creatinine Clearance: 53 mL/min (A) (by C-G formula based on SCr of 1.27 mg/dL (H)). Liver Function Tests: Recent Labs  Lab 10/25/19 0221  AST 19  ALT 20  ALKPHOS 70  BILITOT 0.9  PROT 7.8  ALBUMIN 4.4   Recent Labs  Lab 10/25/19 0221  LIPASE 30   No results for input(s): AMMONIA in the last 168 hours. Coagulation Profile: No results for input(s): INR, PROTIME in the last 168 hours. Cardiac Enzymes: No results for input(s): CKTOTAL, CKMB, CKMBINDEX, TROPONINI in the last 168 hours. BNP (last 3 results) No results for input(s): PROBNP in the last 8760 hours. HbA1C: No results for input(s): HGBA1C in the last 72 hours. CBG: No results  for input(s): GLUCAP in the last 168 hours. Lipid Profile: No results for input(s): CHOL, HDL, LDLCALC, TRIG, CHOLHDL, LDLDIRECT in the last 72 hours. Thyroid Function Tests: No results for input(s): TSH, T4TOTAL, FREET4, T3FREE, THYROIDAB in the last 72 hours. Anemia Panel: No results for input(s): VITAMINB12, FOLATE, FERRITIN, TIBC, IRON, RETICCTPCT in the last 72 hours. Urine analysis:    Component Value Date/Time   COLORURINE Yellow 09/27/2014 1705   APPEARANCEUR Clear 09/27/2014 1705   LABSPEC 1.018 09/27/2014 1705   PHURINE 5.0 09/27/2014 1705   GLUCOSEU Negative 09/27/2014 1705   HGBUR Negative 09/27/2014 1705   BILIRUBINUR Negative 09/27/2014 1705   KETONESUR Negative 09/27/2014 1705   PROTEINUR  Negative 09/27/2014 1705   NITRITE Negative 09/27/2014 1705   LEUKOCYTESUR Trace 09/27/2014 1705   Sepsis Labs: @LABRCNTIP (procalcitonin:4,lacticacidven:4)  ) Recent Results (from the past 240 hour(s))  Respiratory Panel by RT PCR (Flu A&B, Covid) - Nasopharyngeal Swab     Status: None   Collection Time: 10/25/19  3:55 AM   Specimen: Nasopharyngeal Swab  Result Value Ref Range Status   SARS Coronavirus 2 by RT PCR NEGATIVE NEGATIVE Final    Comment: (NOTE) SARS-CoV-2 target nucleic acids are NOT DETECTED. The SARS-CoV-2 RNA is generally detectable in upper respiratoy specimens during the acute phase of infection. The lowest concentration of SARS-CoV-2 viral copies this assay can detect is 131 copies/mL. A negative result does not preclude SARS-Cov-2 infection and should not be used as the sole basis for treatment or other patient management decisions. A negative result may occur with  improper specimen collection/handling, submission of specimen other than nasopharyngeal swab, presence of viral mutation(s) within the areas targeted by this assay, and inadequate number of viral copies (<131 copies/mL). A negative result must be combined with clinical observations, patient history, and epidemiological information. The expected result is Negative. Fact Sheet for Patients:  PinkCheek.be Fact Sheet for Healthcare Providers:  GravelBags.it This test is not yet ap proved or cleared by the [place] FDA and  has been authorized for detection and/or diagnosis of SARS-CoV-2 by FDA under an Emergency Use Authorization (EUA). This EUA will remain  in effect (meaning this test can be used) for the duration of the COVID-19 declaration under Section 564(b)(1) of the Act, 21 U.S.C. section 360bbb-3(b)(1), unless the authorization is terminated or revoked sooner.    Influenza A by PCR NEGATIVE NEGATIVE Final   Influenza B by PCR  NEGATIVE NEGATIVE Final    Comment: (NOTE) The Xpert Xpress SARS-CoV-2/FLU/RSV assay is intended as an aid in  the diagnosis of influenza from Nasopharyngeal swab specimens and  should not be used as a sole basis for treatment. Nasal washings and  aspirates are unacceptable for Xpert Xpress SARS-CoV-2/FLU/RSV  testing. Fact Sheet for Patients: PinkCheek.be Fact Sheet for Healthcare Providers: GravelBags.it This test is not yet approved or cleared by the [place] FDA and  has been authorized for detection and/or diagnosis of SARS-CoV-2 by  FDA under an Emergency Use Authorization (EUA). This EUA will remain  in effect (meaning this test can be used) for the duration of the  Covid-19 declaration under Section 564(b)(1) of the Act, 21  U.S.C. section 360bbb-3(b)(1), unless the authorization is  terminated or revoked. Performed at [facility], [address], [place], [place] [address]          Radiology Studies: CT ABDOMEN PELVIS WO CONTRAST  Result Date: 10/25/2019 CLINICAL DATA:  Abdominal pain history of abdominal surgery EXAM: CT ABDOMEN AND  PELVIS WITHOUT CONTRAST TECHNIQUE: Multidetector CT imaging of the abdomen and pelvis was performed following the standard protocol without IV contrast. COMPARISON:  September 27, 2014 FINDINGS: Lower chest: The visualized heart size within normal limits. No pericardial fluid/thickening. No hiatal hernia. The visualized portions of the lungs are clear. Hepatobiliary: The liver is normal in density without focal abnormality.The main portal vein is patent. The patient is status post cholecystectomy. Pancreas: Unremarkable. No pancreatic ductal dilatation or surrounding inflammatory changes. Spleen: Normal in size without focal abnormality. Adrenals/Urinary Tract: Both adrenal glands appear normal. The patient is status post left nephrectomy. There is stable slight nodularity of  the left adrenal gland. Bladder is partially decompressed. Stomach/Bowel: The stomach is normal in appearance. There are mildly prominent air fluid-filled proximal jejunal and ileal measuring up to 2.8 cm to the level of the large inferior ventral wall hernia which contains several small bowel loops. There there is a gradual area of narrowing within an ileal loops in the hernia, and the distal ileal loops appear to be decompressed. This is best seen series 2, image 63. Within the left lower abdomen there appears to be a small bowel anastomosis with a focally dilated fluid and air-filled loop of small bowel measuring up to 4.6 cm. The distal ileal loops appear to be decompressed. Scattered colonic diverticula are noted. Vascular/Lymphatic: There are no enlarged mesenteric, retroperitoneal, or pelvic lymph nodes. Scattered aortic atherosclerotic calcifications are seen without aneurysmal dilatation. Reproductive: The patient is status post hysterectomy. No adnexal masses or collections seen. Other: Lower anterior abdominal wall hernia is noted with loops of small bowel and fat. Musculoskeletal: No acute or significant osseous findings. The patient has had a prior decompression at L4-L5 with osseous fusion. IMPRESSION: Mildly prominent proximal small bowel loops with focally dilated loop at the small bowel anastomosis with a gradual area of narrowing seen within ileal loops contain in the anterior wall hernia. This could represent ileus versus early partial small bowel obstruction. Diverticulosis without diverticulitis. Aortic Atherosclerosis (ICD10-I70.0). Electronically Signed   By: Jonna Clark M.D.   On: 10/25/2019 03:06   DG Abdomen 1 View  Result Date: 10/25/2019 CLINICAL DATA:  Nasogastric tube placement EXAM: ABDOMEN - 1 VIEW COMPARISON:  Earlier today FINDINGS: Enteric tube tip and side-port overlaps the stomach. No change in upper abdominal bowel gas compared to preceding CT. Lung bases are clear.  IMPRESSION: Nasogastric tube with tip in good position over the mid stomach. Electronically Signed   By: Marnee Spring M.D.   On: 10/25/2019 04:28   DG Abd Portable 1V-Small Bowel Protocol-Position Verification  Result Date: 10/25/2019 CLINICAL DATA:  Small-bowel obstruction. Nasogastric tube placement. EXAM: PORTABLE ABDOMEN - 1 VIEW COMPARISON:  Earlier today FINDINGS: Nasogastric tube is similarly positioned with its tip in the body of the stomach. Few gas-filled loops of jejunum are visible. IMPRESSION: Nasogastric tube unchanged with its tip in the body of the stomach. Electronically Signed   By: Paulina Fusi M.D.   On: 10/25/2019 15:56        Scheduled Meds: . diatrizoate meglumine-sodium  90 mL Per NG tube Once  . enoxaparin (LOVENOX) injection  40 mg Subcutaneous Q24H   Continuous Infusions: . 0.45 % NaCl with KCl 20 mEq / L 100 mL/hr at 10/25/19 0555     LOS: 0 days    Time spent: 25 minutes    Alberteen Sam, MD [facility] 10/25/2019, 5:57 PM     Please page though AMION or Epic secure chat:  For password, contact charge nurse

## 2019-10-25 NOTE — H&P (Signed)
History and Physical    Carmen Washington IWP:[id number] DOB: 13-Nov-1950 DOA: 10/25/2019  PCP: Idelle Crouch, MD   Patient coming from: home  I have personally briefly reviewed patient's old medical records in [place]  Chief Complaint: Abdominal pain and vomiting  HPI: Carmen Washington is a 69 y.o. female with medical history significant for hypertension, CKD 3 and history of multiple abdominal surgeries, most recently in 2012 and a known abdominal wall hernia, per patient, who presents to the emergency room with right lower quadrant pain radiating to right flank associated with vomiting that started a few hours prior to arrival.  She has no fever or chills and denies chest pain or shortness of breath  ED Course: On arrival to the emergency room she was afebrile, normotensive, heart rate 71 and O2 sat 98% on room air.  On her blood work she had a white cell count of 11,000, creatinine was 1.27 which is around her baseline and blood work was otherwise unremarkable.  Troponin was 3.  Liver enzymes unremarkable.  CT abdomen and pelvis showed prominent proximal small bowel loops representing ileus versus early partial small bowel obstruction.  Also showed diverticulosis without diverticulitis.  An NG tube was placed, patient was treated with fentanyl and Zofran and hospitalist consulted for admission  Review of Systems: As per HPI otherwise 10 point review of systems negative.    Past Medical History:  Diagnosis Date  . Hiatal hernia   . Hypertension   . Incisional hernia     Past Surgical History:  Procedure Laterality Date  . CHOLECYSTECTOMY    . DEBRIDEMENT AND CLOSURE WOUND    . HERNIA REPAIR    . TOTAL HIP ARTHROPLASTY       reports that she has never smoked. She has never used smokeless tobacco. She reports that she does not drink alcohol or use drugs.  Allergies  Allergen Reactions  . Codeine Nausea And Vomiting  . Sulfa Antibiotics Rash  . Tetracycline Rash      Family History  Problem Relation Age of Onset  . Breast cancer Cousin      Prior to Admission medications   Medication Sig Start Date End Date Taking? Authorizing Provider  acetaminophen (TYLENOL) 500 MG tablet Take 500 mg by mouth every 6 (six) hours as needed.    [provider]  aspirin 325 MG EC tablet Take 325 mg by mouth 2 (two) times daily with a meal. 10/16/17   [provider]  [employer] STOOL SOFTENER 100 MG capsule Take 100 mg by mouth 2 (two) times daily. 10/16/17   [provider]  tapentadol (NUCYNTA) 50 MG tablet Take 50 mg by mouth every 4 (four) hours as needed.    [provider]    Physical Exam: Vitals:   10/25/19 0211 10/25/19 0213  BP: 133/61   Pulse: 71   Resp: 16   Temp: 97.7 F (36.5 C)   TempSrc: Oral   SpO2: 98%   Weight:  111.1 kg  Height:  5\' 7"  (1.702 m)     Vitals:   10/25/19 0211 10/25/19 0213  BP: 133/61   Pulse: 71   Resp: 16   Temp: 97.7 F (36.5 C)   TempSrc: Oral   SpO2: 98%   Weight:  111.1 kg  Height:  5\' 7"  (1.702 m)    Constitutional: NAD, alert and oriented x 3 Eyes: PERRL, lids and conjunctivae normal ENMT: Mucous membranes are moist. Ng tube Neck:  normal, supple, no masses, no thyromegaly Respiratory: clear to auscultation bilaterally, no wheezing, no crackles. Normal respiratory effort. No accessory muscle use.  Cardiovascular: Regular rate and rhythm, no murmurs / rubs / gallops. No extremity edema. 2+ pedal pulses. No carotid bruits.   Abdomen: RLQ tenderness, no masses palpated. No hepatosplenomegaly. Bowel sounds positive.  Musculoskeletal: no clubbing / cyanosis. No joint deformity upper and lower extremities.  Skin: no rashes, lesions, ulcers.  Neurologic: No gross focal neurologic deficit. Psychiatric: Normal mood and affect.   Labs on Admission: I have personally reviewed following labs and imaging studies  CBC: Recent Labs  Lab 10/25/19 0221  WBC 11.2*  NEUTROABS 7.8*   HGB 14.6  HCT 44.6  MCV 93.3  PLT 220   Basic Metabolic Panel: Recent Labs  Lab 10/25/19 0221  NA 142  K 3.9  CL 104  CO2 29  GLUCOSE 110*  BUN 17  CREATININE 1.27*  CALCIUM 9.6   GFR: Estimated Creatinine Clearance: 53 mL/min (A) (by C-G formula based on SCr of 1.27 mg/dL (H)). Liver Function Tests: Recent Labs  Lab 10/25/19 0221  AST 19  ALT 20  ALKPHOS 70  BILITOT 0.9  PROT 7.8  ALBUMIN 4.4   No results for input(s): LIPASE, AMYLASE in the last 168 hours. No results for input(s): AMMONIA in the last 168 hours. Coagulation Profile: No results for input(s): INR, PROTIME in the last 168 hours. Cardiac Enzymes: No results for input(s): CKTOTAL, CKMB, CKMBINDEX, TROPONINI in the last 168 hours. BNP (last 3 results) No results for input(s): PROBNP in the last 8760 hours. HbA1C: No results for input(s): HGBA1C in the last 72 hours. CBG: No results for input(s): GLUCAP in the last 168 hours. Lipid Profile: No results for input(s): CHOL, HDL, LDLCALC, TRIG, CHOLHDL, LDLDIRECT in the last 72 hours. Thyroid Function Tests: No results for input(s): TSH, T4TOTAL, FREET4, T3FREE, THYROIDAB in the last 72 hours. Anemia Panel: No results for input(s): VITAMINB12, FOLATE, FERRITIN, TIBC, IRON, RETICCTPCT in the last 72 hours. Urine analysis:    Component Value Date/Time   COLORURINE Yellow 09/27/2014 1705   APPEARANCEUR Clear 09/27/2014 1705   LABSPEC 1.018 09/27/2014 1705   PHURINE 5.0 09/27/2014 1705   GLUCOSEU Negative 09/27/2014 1705   HGBUR Negative 09/27/2014 1705   BILIRUBINUR Negative 09/27/2014 1705   KETONESUR Negative 09/27/2014 1705   PROTEINUR Negative 09/27/2014 1705   NITRITE Negative 09/27/2014 1705   LEUKOCYTESUR Trace 09/27/2014 1705    Radiological Exams on Admission: CT ABDOMEN PELVIS WO CONTRAST  Result Date: 10/25/2019 CLINICAL DATA:  Abdominal pain history of abdominal surgery EXAM: CT ABDOMEN AND PELVIS WITHOUT CONTRAST TECHNIQUE:  Multidetector CT imaging of the abdomen and pelvis was performed following the standard protocol without IV contrast. COMPARISON:  September 27, 2014 FINDINGS: Lower chest: The visualized heart size within normal limits. No pericardial fluid/thickening. No hiatal hernia. The visualized portions of the lungs are clear. Hepatobiliary: The liver is normal in density without focal abnormality.The main portal vein is patent. The patient is status post cholecystectomy. Pancreas: Unremarkable. No pancreatic ductal dilatation or surrounding inflammatory changes. Spleen: Normal in size without focal abnormality. Adrenals/Urinary Tract: Both adrenal glands appear normal. The patient is status post left nephrectomy. There is stable slight nodularity of the left adrenal gland. Bladder is partially decompressed. Stomach/Bowel: The stomach is normal in appearance. There are mildly prominent air fluid-filled proximal jejunal and ileal measuring up to 2.8 cm to the level of the large inferior ventral wall hernia which  contains several small bowel loops. There there is a gradual area of narrowing within an ileal loops in the hernia, and the distal ileal loops appear to be decompressed. This is best seen series 2, image 63. Within the left lower abdomen there appears to be a small bowel anastomosis with a focally dilated fluid and air-filled loop of small bowel measuring up to 4.6 cm. The distal ileal loops appear to be decompressed. Scattered colonic diverticula are noted. Vascular/Lymphatic: There are no enlarged mesenteric, retroperitoneal, or pelvic lymph nodes. Scattered aortic atherosclerotic calcifications are seen without aneurysmal dilatation. Reproductive: The patient is status post hysterectomy. No adnexal masses or collections seen. Other: Lower anterior abdominal wall hernia is noted with loops of small bowel and fat. Musculoskeletal: No acute or significant osseous findings. The patient has had a prior decompression at  L4-L5 with osseous fusion. IMPRESSION: Mildly prominent proximal small bowel loops with focally dilated loop at the small bowel anastomosis with a gradual area of narrowing seen within ileal loops contain in the anterior wall hernia. This could represent ileus versus early partial small bowel obstruction. Diverticulosis without diverticulitis. Aortic Atherosclerosis (ICD10-I70.0). Electronically Signed   By: Jonna Clark M.D.   On: 10/25/2019 03:06    EKG: Independently reviewed.   Assessment/Plan Active Problems:   Partial small bowel obstruction (HCC) in the setting of history of multiple abdominal surgeries -Continue NG tube to low intermittent suction -IV antiemetics, IV pain medicine --IV hydration -Consult surgery if no improvement    HTN (hypertension) -Blood pressure controlled -Hold antihypertensives while n.p.o. with IV antiantihypertensives as needed    CKD (chronic kidney disease) stage 3, GFR 30-59 ml/min --At baseline.  Continue to monitor and avoid nephrotoxins      DVT prophylaxis:lovenox Code Status: full code  Family Communication: none  Disposition Plan: Back to previous home environment Consults called: surgery     Andris Baumann MD [facility]     10/25/2019, 3:47 AM

## 2019-10-25 NOTE — Consult Note (Signed)
[facility] SURGICAL CONSULTATION NOTE (initial) - cpt: [id number]   HISTORY OF PRESENT ILLNESS (HPI):  69 y.o. female presented to [facility] ED today for evaluation of abdominal pain. Patient reports that she has some degree of abdominal pain most days but it is typically mild in nature. However, around 11 PM last night she reports the acute onset of severe pain, rated 8 out of 10, which intermittently waxed and waned in severity. She noted associated nausea and emesis with the pain. Emesis was non-bloody and non-bilious. No fever, chills, cough, congestion, SOB, SP, or urinary changes. Last BM was yesterday and she was passing flatus at that time but has since stopped. She does have an extensive previous surgical history including hernia repair, cholecystectomy, partial colectomy, left nephrectomy, and hysterectomy/oophorectomy. Work up in the ED was concerning for very mild leukocytosis to 11K, slight AKI with sCr of 1.27, and proximally dilated loops of small bowel on CT which was concerning for early possible bowel obstruction. She had an NGT placed and was admitted to the hospitalist service.    Surgery is consulted by hospitalist physician Dr. Judd Gaudier, MD in this context for evaluation and management of possible early small bowel obstruction.   PAST MEDICAL HISTORY (PMH):  Past Medical History:  Diagnosis Date  . Hiatal hernia   . Hypertension   . Incisional hernia      PAST SURGICAL HISTORY ([place]):  Past Surgical History:  Procedure Laterality Date  . CHOLECYSTECTOMY    . DEBRIDEMENT AND CLOSURE WOUND    . HERNIA REPAIR    . TOTAL HIP ARTHROPLASTY       MEDICATIONS:  Prior to Admission medications   Not on File     ALLERGIES:  Allergies  Allergen Reactions  . Codeine Nausea And Vomiting  . Sulfa Antibiotics Rash  . Tetracycline Rash     SOCIAL HISTORY:  Social History   Socioeconomic History  . Marital status: Single    Spouse name: Not on file  .  Number of children: Not on file  . Years of education: Not on file  . Highest education level: Not on file  Occupational History  . Not on file  Tobacco Use  . Smoking status: Never Smoker  . Smokeless tobacco: Never Used  Substance and Sexual Activity  . Alcohol use: Never  . Drug use: Never  . Sexual activity: Not on file  Other Topics Concern  . Not on file  Social History Narrative  . Not on file   Social Determinants of Health   Financial Resource Strain:   . Difficulty of Paying Living Expenses: Not on file  Food Insecurity:   . Worried About Charity fundraiser in the Last Year: Not on file  . Ran Out of Food in the Last Year: Not on file  Transportation Needs:   . Lack of Transportation (Medical): Not on file  . Lack of Transportation (Non-Medical): Not on file  Physical Activity:   . Days of Exercise per Week: Not on file  . Minutes of Exercise per Session: Not on file  Stress:   . Feeling of Stress : Not on file  Social Connections:   . Frequency of Communication with Friends and Family: Not on file  . Frequency of Social Gatherings with Friends and Family: Not on file  . Attends Religious Services: Not on file  . Active Member of Clubs or Organizations: Not on file  . Attends Archivist Meetings: Not on  file  . Marital Status: Not on file  Intimate Partner Violence:   . Fear of Current or Ex-Partner: Not on file  . Emotionally Abused: Not on file  . Physically Abused: Not on file  . Sexually Abused: Not on file     FAMILY HISTORY:  Family History  Problem Relation Age of Onset  . Breast cancer Cousin       REVIEW OF SYSTEMS:  Review of Systems  Constitutional: Negative for chills and fever.  HENT: Negative for congestion and sore throat.   Respiratory: Negative for cough and shortness of breath.   Cardiovascular: Negative for chest pain and palpitations.  Gastrointestinal: Positive for abdominal pain, nausea and vomiting. Negative for  blood in stool, constipation and diarrhea.  All other systems reviewed and are negative.   VITAL SIGNS:  Temp:  [97.7 F (36.5 C)] 97.7 F (36.5 C) (01/12 0211) Pulse Rate:  [71-78] 72 (01/12 0630) Resp:  [16-17] 17 (01/12 0630) BP: (127-150)/(61-93) 150/87 (01/12 0630) SpO2:  [98 %-100 %] 100 % (01/12 0630) Weight:  [111.1 kg] 111.1 kg (01/12 0213)     Height: 5\' 7"  (170.2 cm) Weight: 111.1 kg BMI (Calculated): 38.36   INTAKE/OUTPUT:  No intake/output data recorded.  PHYSICAL EXAM:  Physical Exam Vitals and nursing note reviewed.  Constitutional:      General: She is not in acute distress.    Appearance: She is well-developed. She is obese.  HENT:     Head: Normocephalic and atraumatic.     Comments: NGT in place, there appears to be rusty colored output in tubing and canister Eyes:     General: No scleral icterus.    Extraocular Movements: Extraocular movements intact.  Cardiovascular:     Rate and Rhythm: Normal rate and regular rhythm.     Heart sounds: Normal heart sounds.  Pulmonary:     Effort: Pulmonary effort is normal. No respiratory distress.     Breath sounds: Normal breath sounds.  Abdominal:     General: Abdomen is protuberant. A surgical scar is present. There is no distension.     Palpations: Abdomen is soft.     Tenderness: There is abdominal tenderness in the periumbilical area and suprapubic area.     Hernia: A hernia is present.     Comments: Patient has multiple healed surgical incisions present She has tenderness primarily over the midline where there is a large incisional hernia No obvious distension although her morbidly obese abdomen makes this difficult to access No rebound/guarding/rigidity  Genitourinary:    Comments: Deferred Skin:    General: Skin is warm and dry.     Coloration: Skin is not jaundiced or pale.  Neurological:     General: No focal deficit present.     Mental Status: She is alert and oriented to person, place, and time.   Psychiatric:        Mood and Affect: Mood normal.        Behavior: Behavior normal.      Labs:  CBC Latest Ref Rng & Units 10/25/2019 09/27/2014  WBC 4.0 - 10.5 K/uL 11.2(H) 8.8  Hemoglobin 12.0 - 15.0 g/dL 09/29/2014 14.7  Hematocrit 82.9 - 46.0 % 44.6 41.1  Platelets 150 - 400 K/uL 220 210   CMP Latest Ref Rng & Units 10/25/2019 09/27/2014  Glucose 70 - 99 mg/dL 09/29/2014) 87  BUN 8 - 23 mg/dL 17 14  Creatinine 130(Q - 1.00 mg/dL 6.57) 8.46(N  Sodium 6.29 - 145 mmol/L 142  141  Potassium 3.5 - 5.1 mmol/L 3.9 3.9  Chloride 98 - 111 mmol/L 104 108(H)  CO2 22 - 32 mmol/L 29 27  Calcium 8.9 - 10.3 mg/dL 9.6 9.0  Total Protein 6.5 - 8.1 g/dL 7.8 7.6  Total Bilirubin 0.3 - 1.2 mg/dL 0.9 0.5  Alkaline Phos 38 - 126 U/L 70 90  AST 15 - 41 U/L 19 26  ALT 0 - 44 U/L 20 29    Imaging studies:   KUB (10/25/2019) personally reviewed which shows NGT in good position, and radiologist report reviewed:  IMPRESSION: Nasogastric tube with tip in good position over the mid stomach.   CT Abdomen/Pelvis (10/25/2019) personally reviewed which shows dilated loops of small bowel and a large ventral hernia, and radiologist report reviewed:  IMPRESSION: Mildly prominent proximal small bowel loops with focally dilated loop at the small bowel anastomosis with a gradual area of narrowing seen within ileal loops contain in the anterior wall hernia. This could represent ileus versus early partial small bowel obstruction.    Assessment/Plan: (ICD-10's: K73.609) 69 y.o. female with abdominal pain, nausea, and emesis which may be attributed to possible early small bowel obstruction secondary to adhesions given multitude of previous abdominal surgeries although she did report flatus on presentation this has subsided   - NPO + IVF   - Continue NGT decompression; monitor output  - Consider adding PPI/Carafate given rusty appearance to NGT output; likely related from local trauma during placement  - Pain control  (minimize narcotic use is feasible)  - Monitor abdominal examination +/- serial KUBs (if not clinically improvement in 24-48 hours can consider timed KUBs with gastrografin  - No emergent surgical intervention: given her previous surgical history would recommend waiting as long as possible before considering intervention surgically. If she were to require surgical intervention this may be better suited at tertiary center given her history. She understands.   - Mobilization encouraged  - further management per primary team; we will follow  All of the above findings and recommendations were discussed with the patient, and all of patient's questions were answered to her expressed satisfaction.  Thank you for the opportunity to participate in this patient's care.   -- Lynden Oxford, PA-C [facility] 10/25/2019, 7:20 AM [phone] M-F: 7am - 4pm

## 2019-10-25 NOTE — ED Notes (Signed)
Report given to [employer], RN

## 2019-10-25 NOTE — Progress Notes (Signed)
Patient has not voided this shift, MD made aware, will bladder scan patient. Report given to [facility] RN on 2C, will transport after bladder scan.

## 2019-10-25 NOTE — ED Provider Notes (Signed)
[facility] Emergency Department Provider Note  ____________________________________________  Time seen: Approximately 2:40 AM  I have reviewed the triage vital signs and the nursing notes.   HISTORY  Chief Complaint Abdominal Pain   HPI Carmen Washington is a 69 y.o. female the history of hypertension, hyperlipidemia, chronic renal insufficiency, ventral hernia, cholecystectomy, partial colectomy, left nephrectomy, hysterectomy, oophorectomy who presents for evaluation of abdominal pain.  Patient reports that she has mild abdominal pain every day of his life.  Some days, her pain gets worse.  This episode started at 11 PM.  She reports that the pain is sharp 8 out of 10 with waves of 20 out of 10 pain located on the right side of her abdomen associated with nausea and 2 episodes of emesis.  Patient reports not looking at the emesis therefore she does not know if there was any blood or coffee grounds in it.  She had a bowel movement yesterday, she is passing flatus.  She denies dysuria or hematuria, history of kidney stones, history of SBO, chest pain or shortness of breath.   Past Medical History:  Diagnosis Date  . Hiatal hernia   . Hypertension   . Incisional hernia     Patient Active Problem List   Diagnosis Date Noted  . Partial small bowel obstruction (HCC) 10/25/2019  . History of laparotomy 10/25/2019  . HTN (hypertension) 10/25/2019    Past Surgical History:  Procedure Laterality Date  . CHOLECYSTECTOMY    . DEBRIDEMENT AND CLOSURE WOUND    . HERNIA REPAIR    . TOTAL HIP ARTHROPLASTY      Prior to Admission medications   Medication Sig Start Date End Date Taking? Authorizing Provider  acetaminophen (TYLENOL) 500 MG tablet Take 500 mg by mouth every 6 (six) hours as needed.    [provider]  aspirin 325 MG EC tablet Take 325 mg by mouth 2 (two) times daily with a meal. 10/16/17   [provider]  [employer] STOOL SOFTENER 100 MG  capsule Take 100 mg by mouth 2 (two) times daily. 10/16/17   [provider]  tapentadol (NUCYNTA) 50 MG tablet Take 50 mg by mouth every 4 (four) hours as needed.    [provider]    Allergies Codeine, Sulfa antibiotics, and Tetracycline  Family History  Problem Relation Age of Onset  . Breast cancer Cousin     Social History Social History   Tobacco Use  . Smoking status: Never Smoker  . Smokeless tobacco: Never Used  Substance Use Topics  . Alcohol use: Never  . Drug use: Never    Review of Systems  Constitutional: Negative for fever. Eyes: Negative for visual changes. ENT: Negative for sore throat. Neck: No neck pain  Cardiovascular: Negative for chest pain. Respiratory: Negative for shortness of breath. Gastrointestinal: + R sided abdominal pain, nausea, and vomiting. No diarrhea. Genitourinary: Negative for dysuria. Musculoskeletal: Negative for back pain. Skin: Negative for rash. Neurological: Negative for headaches, weakness or numbness. Psych: No SI or HI  ____________________________________________   PHYSICAL EXAM:  VITAL SIGNS: ED Triage Vitals  Enc Vitals Group     BP 10/25/19 0211 133/61     Pulse Rate 10/25/19 0211 71     Resp 10/25/19 0211 16     Temp 10/25/19 0211 97.7 F (36.5 C)     Temp Source 10/25/19 0211 Oral     SpO2 10/25/19 0211 98 %     Weight 10/25/19 0213  245 lb (111.1 kg)     Height 10/25/19 0213 5\' 7"  (1.702 m)     Head Circumference --      Peak Flow --      Pain Score 10/25/19 0213 8     Pain Loc --      Pain Edu? --      Excl. in GC? --     Constitutional: Alert and oriented. Well appearing and in no apparent distress. HEENT:      Head: Normocephalic and atraumatic.         Eyes: Conjunctivae are normal. Sclera is non-icteric.       Mouth/Throat: Mucous membranes are moist.       Neck: Supple with no signs of meningismus. Cardiovascular: Regular rate and rhythm. No murmurs, gallops, or rubs. 2+  symmetrical distal pulses are present in all extremities. No JVD. Respiratory: Normal respiratory effort. Lungs are clear to auscultation bilaterally. No wheezes, crackles, or rhonchi.  Gastrointestinal: Soft, diffusely tender to palpation on the right quadrants, and non distended with positive bowel sounds. No rebound or guarding. Genitourinary: No CVA tenderness. Musculoskeletal: Nontender with normal range of motion in all extremities. No edema, cyanosis, or erythema of extremities. Neurologic: Normal speech and language. Face is symmetric. Moving all extremities. No gross focal neurologic deficits are appreciated. Skin: Skin is warm, dry and intact. No rash noted. Psychiatric: Mood and affect are normal. Speech and behavior are normal.  ____________________________________________   LABS (all labs ordered are listed, but only abnormal results are displayed)  Labs Reviewed  CBC WITH DIFFERENTIAL/PLATELET - Abnormal; Notable for the following components:      Result Value   WBC 11.2 (*)    Neutro Abs 7.8 (*)    All other components within normal limits  COMPREHENSIVE METABOLIC PANEL - Abnormal; Notable for the following components:   Glucose, Bld 110 (*)    Creatinine, Ser 1.27 (*)    GFR calc non Af Amer 43 (*)    GFR calc Af Amer 50 (*)    All other components within normal limits  RESPIRATORY PANEL BY RT PCR (FLU A&B, COVID)  URINALYSIS, COMPLETE (UACMP) WITH MICROSCOPIC  TROPONIN I (HIGH SENSITIVITY)   ____________________________________________  EKG  ED ECG REPORT I, 12/23/19, the attending physician, personally viewed and interpreted this ECG.  Normal sinus rhythm, rate of 79, normal intervals, normal axis, no ST elevations or depressions.  Normal EKG. ____________________________________________  RADIOLOGY  I have personally reviewed the images performed during this visit and I agree with the Radiologist's read.   Interpretation by Radiologist:  CT  ABDOMEN PELVIS WO CONTRAST  Result Date: 10/25/2019 CLINICAL DATA:  Abdominal pain history of abdominal surgery EXAM: CT ABDOMEN AND PELVIS WITHOUT CONTRAST TECHNIQUE: Multidetector CT imaging of the abdomen and pelvis was performed following the standard protocol without IV contrast. COMPARISON:  September 27, 2014 FINDINGS: Lower chest: The visualized heart size within normal limits. No pericardial fluid/thickening. No hiatal hernia. The visualized portions of the lungs are clear. Hepatobiliary: The liver is normal in density without focal abnormality.The main portal vein is patent. The patient is status post cholecystectomy. Pancreas: Unremarkable. No pancreatic ductal dilatation or surrounding inflammatory changes. Spleen: Normal in size without focal abnormality. Adrenals/Urinary Tract: Both adrenal glands appear normal. The patient is status post left nephrectomy. There is stable slight nodularity of the left adrenal gland. Bladder is partially decompressed. Stomach/Bowel: The stomach is normal in appearance. There are mildly prominent air fluid-filled proximal jejunal and ileal  measuring up to 2.8 cm to the level of the large inferior ventral wall hernia which contains several small bowel loops. There there is a gradual area of narrowing within an ileal loops in the hernia, and the distal ileal loops appear to be decompressed. This is best seen series 2, image 63. Within the left lower abdomen there appears to be a small bowel anastomosis with a focally dilated fluid and air-filled loop of small bowel measuring up to 4.6 cm. The distal ileal loops appear to be decompressed. Scattered colonic diverticula are noted. Vascular/Lymphatic: There are no enlarged mesenteric, retroperitoneal, or pelvic lymph nodes. Scattered aortic atherosclerotic calcifications are seen without aneurysmal dilatation. Reproductive: The patient is status post hysterectomy. No adnexal masses or collections seen. Other: Lower anterior  abdominal wall hernia is noted with loops of small bowel and fat. Musculoskeletal: No acute or significant osseous findings. The patient has had a prior decompression at L4-L5 with osseous fusion. IMPRESSION: Mildly prominent proximal small bowel loops with focally dilated loop at the small bowel anastomosis with a gradual area of narrowing seen within ileal loops contain in the anterior wall hernia. This could represent ileus versus early partial small bowel obstruction. Diverticulosis without diverticulitis. Aortic Atherosclerosis (ICD10-I70.0). Electronically Signed   By: Jonna Clark M.D.   On: 10/25/2019 03:06      ____________________________________________   PROCEDURES  Procedure(s) performed: None Procedures Critical Care performed:  None ____________________________________________   INITIAL IMPRESSION / ASSESSMENT AND PLAN / ED COURSE   69 y.o. female the history of hypertension, hyperlipidemia, chronic renal insufficiency, ventral hernia, cholecystectomy, partial colectomy, left nephrectomy, hysterectomy, oophorectomy who presents for evaluation of abdominal pain, N, V.  Differential diagnosis including SBO versus kidney stone versus pyelonephritis versus diverticulitis versus pancreatitis versus UTI versus colitis versus acute on chronic exacerbation of her pain.  We will check CBC, CMP, lipase, urinalysis, CT abdomen pelvis.  Will treat the the symptoms with fentanyl and Zofran.    _________________________ 3:43 AM on 10/25/2019 -----------------------------------------  CT consistent with early small bowel obstruction.  Will place an NG tube and admit patient to the hospitalist service for monitoring.    As part of my medical decision making, I reviewed the following data within the electronic MEDICAL RECORD NUMBER Nursing notes reviewed and incorporated, Labs reviewed , EKG interpreted , Old EKG reviewed, Old chart reviewed, Radiograph reviewed , Discussed with admitting  physician , Notes from prior ED visits and [place] Controlled Substance Database   Please note:  Patient was evaluated in Emergency Department today for the symptoms described in the history of present illness. Patient was evaluated in the context of the global COVID-19 pandemic, which necessitated consideration that the patient might be at risk for infection with the SARS-CoV-2 virus that causes COVID-19. Institutional protocols and algorithms that pertain to the evaluation of patients at risk for COVID-19 are in a state of rapid change based on information released by regulatory bodies including the CDC and federal and state organizations. These policies and algorithms were followed during the patient's care in the ED.  Some ED evaluations and interventions may be delayed as a result of limited staffing during the pandemic.   ____________________________________________   FINAL CLINICAL IMPRESSION(S) / ED DIAGNOSES   Final diagnoses:  SBO (small bowel obstruction) (HCC)      NEW MEDICATIONS STARTED DURING THIS VISIT:  ED Discharge Orders    None       Note:  This document was prepared using Dragon voice recognition software  and may include unintentional dictation errors.    Alfred Levins, [place], MD 10/25/19 [phone]

## 2019-10-26 ENCOUNTER — Inpatient Hospital Stay: Payer: PPO

## 2019-10-26 DIAGNOSIS — N1831 Chronic kidney disease, stage 3a: Secondary | ICD-10-CM

## 2019-10-26 LAB — COMPREHENSIVE METABOLIC PANEL
ALT: 22 U/L (ref 0–44)
AST: 21 U/L (ref 15–41)
Albumin: 3.4 g/dL — ABNORMAL LOW (ref 3.5–5.0)
Alkaline Phosphatase: 55 U/L (ref 38–126)
Anion gap: 6 (ref 5–15)
BUN: 12 mg/dL (ref 8–23)
CO2: 28 mmol/L (ref 22–32)
Calcium: 8.4 mg/dL — ABNORMAL LOW (ref 8.9–10.3)
Chloride: 107 mmol/L (ref 98–111)
Creatinine, Ser: 1.26 mg/dL — ABNORMAL HIGH (ref 0.44–1.00)
GFR calc Af Amer: 50 mL/min — ABNORMAL LOW (ref >60)
GFR calc non Af Amer: 43 mL/min — ABNORMAL LOW (ref >60)
Glucose, Bld: 78 mg/dL (ref 70–99)
Potassium: 4.5 mmol/L (ref 3.5–5.1)
Sodium: 141 mmol/L (ref 135–145)
Total Bilirubin: 1.1 mg/dL (ref 0.3–1.2)
Total Protein: 6 g/dL — ABNORMAL LOW (ref 6.5–8.1)

## 2019-10-26 LAB — CBC
HCT: 41.6 % (ref 36.0–46.0)
Hemoglobin: 12.9 g/dL (ref 12.0–15.0)
MCH: 30.4 pg (ref 26.0–34.0)
MCHC: 31 g/dL (ref 30.0–36.0)
MCV: 98.1 fL (ref 80.0–100.0)
Platelets: 152 10*3/uL (ref 150–400)
RBC: 4.24 MIL/uL (ref 3.87–5.11)
RDW: 12.9 % (ref 11.5–15.5)
WBC: 7.6 10*3/uL (ref 4.0–10.5)
nRBC: 0 % (ref 0.0–0.2)

## 2019-10-26 MED ORDER — PHENOL 1.4 % MT LIQD
1.0000 | OROMUCOSAL | Status: DC | PRN
  Administered 2019-10-26: 1 via OROMUCOSAL
  Filled 2019-10-26 (×2): qty 177

## 2019-10-26 NOTE — Progress Notes (Addendum)
[facility] SURGICAL PROGRESS NOTE (cpt [phone])  Hospital Day(s): 1.   Interval History: Patient seen and examined, no acute events or new complaints overnight. Patient reports "she is feeling much better this morning." abdominal pain has resolved. No fever, chills, nausea, or emesis. She is passing flatus. NGT with 450 ccs in last 24 hours. KUB this morning shows contrast throughout the colon. No other new complaints.   Review of Systems:  Constitutional: denies fever, chills  HEENT: denies cough or congestion  Respiratory: denies any shortness of breath  Cardiovascular: denies chest pain or palpitations  Gastrointestinal: denies abdominal pain, N/V, or diarrhea/and bowel function as per interval history Genitourinary: denies burning with urination or urinary frequency   Vital signs in last 24 hours: [min-max] current  Temp:  [98 F (36.7 C)-98.7 F (37.1 C)] 98.5 F (36.9 C) (01/13 0544) Pulse Rate:  [64-79] 79 (01/13 0544) Resp:  [16-20] 20 (01/13 0544) BP: (106-116)/(55-75) 116/59 (01/13 0544) SpO2:  [94 %-100 %] 94 % (01/13 0544)     Height: 5\' 7"  (170.2 cm) Weight: 111.1 kg BMI (Calculated): 38.36   Intake/Output last 2 shifts:  01/12 0701 - 01/13 0700 In: 2975.5 [I.V.:2398.1; NG/GT:90; IV Piggyback:487.4] Out: 850 [Urine:400; Emesis/NG output:450]   Physical Exam:  Constitutional: alert, cooperative and no distress  HENT: NGT in place Respiratory: breathing non-labored at rest  Cardiovascular: regular rate and sinus rhythm  Gastrointestinal: soft, non-tender, and non-distended, no rebound or guarding. She does have large ventral hernia and multiple healed surgical scars on her abdomen.  Musculoskeletal: no edema or wounds, motor and sensation grossly intact, NT    Labs:  CBC Latest Ref Rng & Units 10/26/2019 10/25/2019 09/27/2014  WBC 4.0 - 10.5 K/uL 7.6 11.2(H) 8.8  Hemoglobin 12.0 - 15.0 g/dL 12.9 14.6 13.6  Hematocrit 36.0 - 46.0 % 41.6 44.6 41.1   Platelets 150 - 400 K/uL 152 220 210   CMP Latest Ref Rng & Units 10/26/2019 10/25/2019 09/27/2014  Glucose 70 - 99 mg/dL 78 110(H) 87  BUN 8 - 23 mg/dL 12 17 14   Creatinine 0.44 - 1.00 mg/dL 1.26(H) 1.27(H) 1.13  Sodium 135 - 145 mmol/L 141 142 141  Potassium 3.5 - 5.1 mmol/L 4.5 3.9 3.9  Chloride 98 - 111 mmol/L 107 104 108(H)  CO2 22 - 32 mmol/L 28 29 27   Calcium 8.9 - 10.3 mg/dL 8.4(L) 9.6 9.0  Total Protein 6.5 - 8.1 g/dL 6.0(L) 7.8 7.6  Total Bilirubin 0.3 - 1.2 mg/dL 1.1 0.9 0.5  Alkaline Phos 38 - 126 U/L 55 70 90  AST 15 - 41 U/L 21 19 26   ALT 0 - 44 U/L 22 20 29      Imaging studies:   KUB with gastrografin (10/26/2019) personally reviewed which shows contrast throughout the colon, and radiologist report reviewed: IMPRESSION: Oral contrast reaches the non dilated splenic flexure.   Assessment/Plan: (ICD-10's: K77.609) 69 y.o. female with now resolved possible early small bowel obstruction most likely secondary to adhesions given multitude of previous abdominal surgeries   - given contrast now throughout the colon and return of bowel function, okay to remove NGT  - Initiate CLD; ADAT   - Wean from IVF  - No emergent surgical intervention   - further management per primary team    - General surgery will sign off; does not need surgery follow up outpatient; please call with questions/concerns/mew issues   All of the above findings and recommendations were discussed with the patient, and the medical  team, and all of patient's questions were answered to herr expressed satisfaction.  -- Lynden Oxford, PA-C [facility] 10/26/2019, 9:11 AM [phone] M-F: 7am - 4pm

## 2019-10-26 NOTE — Progress Notes (Signed)
Brief Note:  Brief Narrative:  Carmen Washington is a 69 y.o. F with obesity, DM, CKD 3, and multiple abdominal surgeries, most recently in 2012 who presented with acute sharp right lower quadrant pain radiating to the back with vomiting. In the ER, the patient vomited several times, creatinine 1.27, at baseline, WBC 11 K, CT abdomen pelvis showed small bowel obstruction.  NG tube was placed, and the hospitalist service were asked to evaluate for SBO.    Subjective: Was feeling some abdominal discomfort this morning but somewhat better than admission.  Still having NG tube in and draining dark greenish fluid.  Has not had any diet since admission as of this noon.  Objective: Vital signs in last 24 hours: Temp:  [98 F (36.7 C)-98.7 F (37.1 C)] 98.5 F (36.9 C) (01/13 1340) Pulse Rate:  [64-79] 65 (01/13 1340) Resp:  [20] 20 (01/13 1340) BP: (115-133)/(59-75) 133/71 (01/13 1340) SpO2:  [94 %-100 %] 95 % (01/13 1340)  Intake/Output from previous day: 01/12 0701 - 01/13 0700 In: 2975.5 [I.V.:2398.1] Out: 850 [Urine:400] Intake/Output this shift: Total I/O In: 370.7 [I.V.:370.7] Out: 700 [Urine:400; Emesis/NG output:300]  Constitutional: NAD, alert and oriented x 3; all distress lying on bed. Eyes: PERRL, lids and conjunctivae normal ENMT: Mucous membranes are moist. Ng tube Neck: normal, supple, no masses, no thyromegaly Respiratory: clear to auscultation bilaterally, no wheezing, no crackles. Normal respiratory effort. No accessory muscle use.  Cardiovascular: Regular rate and rhythm, no murmurs / rubs / gallops. No extremity edema. 2+ pedal pulses. No carotid bruits.  Abdomen: RLQ tenderness, no masses palpated. No hepatosplenomegaly. Bowel sounds positive.  Musculoskeletal: no clubbing / cyanosis. No joint deformity upper and lower extremities.  Skin: no rashes, lesions, ulcers.  Neurologic: No gross focal neurologic deficit. Psychiatric: Normal mood and affect.  Results for  orders placed or performed during the hospital encounter of 10/25/19 (from the past 24 hour(s))  CBC     Status: None   Collection Time: 10/26/19  4:38 AM  Result Value Ref Range   WBC 7.6 4.0 - 10.5 K/uL   RBC 4.24 3.87 - 5.11 MIL/uL   Hemoglobin 12.9 12.0 - 15.0 g/dL   HCT 71.0 62.6 - 94.8 %   MCV 98.1 80.0 - 100.0 fL   MCH 30.4 26.0 - 34.0 pg   MCHC 31.0 30.0 - 36.0 g/dL   RDW 54.6 27.0 - 35.0 %   Platelets 152 150 - 400 K/uL   nRBC 0.0 0.0 - 0.2 %  Comprehensive metabolic panel     Status: Abnormal   Collection Time: 10/26/19  4:38 AM  Result Value Ref Range   Sodium 141 135 - 145 mmol/L   Potassium 4.5 3.5 - 5.1 mmol/L   Chloride 107 98 - 111 mmol/L   CO2 28 22 - 32 mmol/L   Glucose, Bld 78 70 - 99 mg/dL   BUN 12 8 - 23 mg/dL   Creatinine, Ser 0.93 (H) 0.44 - 1.00 mg/dL   Calcium 8.4 (L) 8.9 - 10.3 mg/dL   Total Protein 6.0 (L) 6.5 - 8.1 g/dL   Albumin 3.4 (L) 3.5 - 5.0 g/dL   AST 21 15 - 41 U/L   ALT 22 0 - 44 U/L   Alkaline Phosphatase 55 38 - 126 U/L   Total Bilirubin 1.1 0.3 - 1.2 mg/dL   GFR calc non Af Amer 43 (L) >60 mL/min   GFR calc Af Amer 50 (L) >60 mL/min   Anion gap 6 5 -  15    Studies/Results: CT ABDOMEN PELVIS WO CONTRAST  Result Date: 10/25/2019 CLINICAL DATA:  Abdominal pain history of abdominal surgery EXAM: CT ABDOMEN AND PELVIS WITHOUT CONTRAST TECHNIQUE: Multidetector CT imaging of the abdomen and pelvis was performed following the standard protocol without IV contrast. COMPARISON:  September 27, 2014 FINDINGS: Lower chest: The visualized heart size within normal limits. No pericardial fluid/thickening. No hiatal hernia. The visualized portions of the lungs are clear. Hepatobiliary: The liver is normal in density without focal abnormality.The main portal vein is patent. The patient is status post cholecystectomy. Pancreas: Unremarkable. No pancreatic ductal dilatation or surrounding inflammatory changes. Spleen: Normal in size without focal abnormality.  Adrenals/Urinary Tract: Both adrenal glands appear normal. The patient is status post left nephrectomy. There is stable slight nodularity of the left adrenal gland. Bladder is partially decompressed. Stomach/Bowel: The stomach is normal in appearance. There are mildly prominent air fluid-filled proximal jejunal and ileal measuring up to 2.8 cm to the level of the large inferior ventral wall hernia which contains several small bowel loops. There there is a gradual area of narrowing within an ileal loops in the hernia, and the distal ileal loops appear to be decompressed. This is best seen series 2, image 63. Within the left lower abdomen there appears to be a small bowel anastomosis with a focally dilated fluid and air-filled loop of small bowel measuring up to 4.6 cm. The distal ileal loops appear to be decompressed. Scattered colonic diverticula are noted. Vascular/Lymphatic: There are no enlarged mesenteric, retroperitoneal, or pelvic lymph nodes. Scattered aortic atherosclerotic calcifications are seen without aneurysmal dilatation. Reproductive: The patient is status post hysterectomy. No adnexal masses or collections seen. Other: Lower anterior abdominal wall hernia is noted with loops of small bowel and fat. Musculoskeletal: No acute or significant osseous findings. The patient has had a prior decompression at L4-L5 with osseous fusion. IMPRESSION: Mildly prominent proximal small bowel loops with focally dilated loop at the small bowel anastomosis with a gradual area of narrowing seen within ileal loops contain in the anterior wall hernia. This could represent ileus versus early partial small bowel obstruction. Diverticulosis without diverticulitis. Aortic Atherosclerosis (ICD10-I70.0). Electronically Signed   By: Prudencio Pair M.D.   On: 10/25/2019 03:06   DG Abdomen 1 View  Result Date: 10/25/2019 CLINICAL DATA:  Nasogastric tube placement EXAM: ABDOMEN - 1 VIEW COMPARISON:  Earlier today FINDINGS:  Enteric tube tip and side-port overlaps the stomach. No change in upper abdominal bowel gas compared to preceding CT. Lung bases are clear. IMPRESSION: Nasogastric tube with tip in good position over the mid stomach. Electronically Signed   By: Monte Fantasia M.D.   On: 10/25/2019 04:28   DG Abd Portable 1V-Small Bowel Obstruction Protocol-initial, 8 hr delay  Result Date: 10/26/2019 CLINICAL DATA:  Small bowel obstruction 8 hour delay with Gastrografin EXAM: PORTABLE ABDOMEN - 1 VIEW COMPARISON:  Yesterday FINDINGS: Oral contrast is seen primarily within colon, reaching the splenic flexure. Enteric tube with tip over the stomach. Assessment of small bowel distention is limited due to the extent of oral contrast. Bilateral hip arthroplasty. IMPRESSION: Oral contrast reaches the non dilated splenic flexure. Electronically Signed   By: Monte Fantasia M.D.   On: 10/26/2019 06:06   DG Abd Portable 1V-Small Bowel Protocol-Position Verification  Result Date: 10/25/2019 CLINICAL DATA:  Small-bowel obstruction. Nasogastric tube placement. EXAM: PORTABLE ABDOMEN - 1 VIEW COMPARISON:  Earlier today FINDINGS: Nasogastric tube is similarly positioned with its tip in the body of  the stomach. Few gas-filled loops of jejunum are visible. IMPRESSION: Nasogastric tube unchanged with its tip in the body of the stomach. Electronically Signed   By: Paulina Fusi M.D.   On: 10/25/2019 15:56    Scheduled Meds: . enoxaparin (LOVENOX) injection  40 mg Subcutaneous Q24H   Continuous Infusions: . 0.45 % NaCl with KCl 20 mEq / L 125 mL/hr at 10/26/19 1210   PRN Meds:acetaminophen **OR** acetaminophen, morphine injection, ondansetron **OR** ondansetron (ZOFRAN) IV, phenol  Assessment/Plan:   Partial small bowel obstruction (HCC) in the setting of history of multiple abdominal surgeries: -S/P contrast study with contrast now throughout the colon and return of bowel function -NG tube removal per surgery today -IV  antiemetics, IV pain medicine -Started clear liquid diet and will advance as tolerated gradually --IV hydration with lower rate as p.o. intake improves -Appreciate surgery recommendation and care    HTN (hypertension) -Blood pressure controlled -May resume antihypertensives with IV antiantihypertensives as needed    CKD (chronic kidney disease) stage 3, GFR 30-59 ml/min --At baseline.  Continue to monitor and avoid nephrotoxins   LOS: 1 day   Carmen Washington Harold Hedge

## 2019-10-27 ENCOUNTER — Inpatient Hospital Stay: Payer: PPO

## 2019-10-27 LAB — BASIC METABOLIC PANEL
Anion gap: 8 (ref 5–15)
BUN: 11 mg/dL (ref 8–23)
CO2: 26 mmol/L (ref 22–32)
Calcium: 8.7 mg/dL — ABNORMAL LOW (ref 8.9–10.3)
Chloride: 106 mmol/L (ref 98–111)
Creatinine, Ser: 1.32 mg/dL — ABNORMAL HIGH (ref 0.44–1.00)
GFR calc Af Amer: 47 mL/min — ABNORMAL LOW (ref >60)
GFR calc non Af Amer: 41 mL/min — ABNORMAL LOW (ref >60)
Glucose, Bld: 83 mg/dL (ref 70–99)
Potassium: 4.7 mmol/L (ref 3.5–5.1)
Sodium: 140 mmol/L (ref 135–145)

## 2019-10-27 MED ORDER — SODIUM CHLORIDE 0.45 % IV SOLN: INTRAVENOUS | Status: DC

## 2019-10-27 MED ORDER — ACETAMINOPHEN 325 MG PO TABS: 650.0000 mg | ORAL_TABLET | Freq: Four times a day (QID) | ORAL | 0 refills | Status: DC | PRN

## 2019-10-27 MED ORDER — TRAMADOL HCL 50 MG PO TABS
25.0000 mg | ORAL_TABLET | Freq: Two times a day (BID) | ORAL | Status: DC | PRN
  Administered 2019-10-27: 25 mg via ORAL
  Filled 2019-10-27: qty 1

## 2019-10-27 MED ORDER — ONDANSETRON 4 MG PO TBDP: 4.0000 mg | ORAL_TABLET | Freq: Four times a day (QID) | ORAL | 0 refills | Status: DC | PRN

## 2019-10-27 NOTE — Discharge Instructions (Signed)
Bowel Obstruction °A bowel obstruction means that something is blocking the small or large bowel. The bowel is also called the intestine. It is the long tube that connects the stomach to the opening of the butt (anus). When something blocks the bowel, food and fluids cannot pass through like normal. This condition needs to be treated. Treatment depends on the cause of the problem and how bad the problem is. °What are the causes? °Common causes of this condition include: °· Scar tissue (adhesions) from past surgery or from high-energy X-rays (radiation). °· Recent surgery in the belly. This affects how food moves in the bowel. °· Some diseases, such as: °? Irritation of the lining of the digestive tract (Crohn's disease). °? Irritation of small pouches in the bowel (diverticulitis). °· Growths or tumors. °· A bulging organ (hernia). °· Twisting of the bowel (volvulus). °· A foreign body. °· Slipping of a part of the bowel into another part (intussusception). °What are the signs or symptoms? °Symptoms of this condition include: °· Pain in the belly. °· Feeling sick to your stomach (nauseous). °· Throwing up (vomiting). °· Bloating in the belly. °· Being unable to pass gas. °· Trouble pooping (constipation). °· Watery poop (diarrhea). °· A lot of belching. °How is this diagnosed? °This condition may be diagnosed based on: °· A physical exam. °· Medical history. °· Imaging tests, such as X-ray or CT scan. °· Blood tests. °· Urine tests. °How is this treated? °Treatment for this condition may include: °· Fluids and pain medicines that are given through an IV tube. Your doctor may tell you not to eat or drink if you feel sick to your stomach and are throwing up. °· Eating a clear liquid diet for a few days. °· Putting a small tube (nasogastric tube) into the stomach. This will help with pain, discomfort, and nausea by removing blocked air and fluids from the stomach. °· Surgery. This may be needed if other treatments do  not work. °Follow these instructions at home: °Medicines °· Take over-the-counter and prescription medicines only as told by your doctor. °· If you were prescribed an antibiotic medicine, take it as told by your doctor. Do not stop taking the antibiotic even if you start to feel better. °General instructions °· Follow your diet as told by your doctor. You may need to: °? Only drink clear liquids until you start to get better. °? Avoid solid foods. °· Return to your normal activities as told by your doctor. Ask your doctor what activities are safe for you. °· Do not sit for a long time without moving. Get up to take short walks every 1-2 hours. This is important. Ask for help if you feel weak or unsteady. °· Keep all follow-up visits as told by your doctor. This is important. °How is this prevented? °After having a bowel obstruction, you may be more likely to have another. You can do some things to stop it from happening again. °· If you have a long-term (chronic) disease, contact your doctor if you see changes or problems. °· Take steps to prevent or treat trouble pooping. Your doctor may ask that you: °? Drink enough fluid to keep your pee (urine) pale yellow. °? Take over-the-counter or prescription medicines. °? Eat foods that are high in fiber. These include beans, whole grains, and fresh fruits and vegetables. °? Limit foods that are high in fat and sugar. These include fried or sweet foods. °· Stay active. Ask your doctor which exercises are   safe for you. °· Avoid stress. °· Eat three small meals and three small snacks each day. °· Work with a food expert (dietitian) to make a meal plan that works for you. °· Do not use any products that contain nicotine or tobacco, such as cigarettes and e-cigarettes. If you need help quitting, ask your doctor. °Contact a doctor if: °· You have a fever. °· You have chills. °Get help right away if: °· You have pain or cramps that get worse. °· You throw up blood. °· You are  sick to your stomach. °· You cannot stop throwing up. °· You cannot drink fluids. °· You feel mixed up (confused). °· You feel very thirsty (dehydrated). °· Your belly gets more bloated. °· You feel weak or you pass out (faint). °Summary °· A bowel obstruction means that something is blocking the small or large bowel. °· Treatment may include IV fluids and pain medicine. You may also have a clear liquid diet, a small tube in your stomach, or surgery. °· Drink clear liquids and avoid solid foods until you get better. °This information is not intended to replace advice given to you by your health care provider. Make sure you discuss any questions you have with your health care provider. °Document Revised: 02/10/2018 Document Reviewed: 02/10/2018 °Elsevier Patient Education © 2020 [employer] ° °

## 2019-10-27 NOTE — Progress Notes (Signed)
Wilmary A Old  A and O x 4. VSS. Pt tolerating diet well. No complaints of pain or nausea. IV removed intact, prescriptions given. Pt voiced understanding of discharge instructions with no further questions. Pt discharged via wheelchair with NT.     Allergies as of 10/27/2019      Reactions   Codeine Nausea And Vomiting   Sulfa Antibiotics Rash   Tetracycline Rash      Medication List    TAKE these medications   acetaminophen 325 MG tablet Commonly known as: TYLENOL Take 2 tablets (650 mg total) by mouth every 6 (six) hours as needed for mild pain (or Fever >/= 101).   ondansetron 4 MG disintegrating tablet Commonly known as: Zofran ODT Take 1 tablet (4 mg total) by mouth every 6 (six) hours as needed for nausea or vomiting.       Vitals:   10/27/19 0424 10/27/19 1505  BP: 131/66 (!) 136/58  Pulse: (!) 57 (!) 55  Resp: 18   Temp: 98.3 F (36.8 C) 98.6 F (37 C)  SpO2: 97% 99%    Suzzanne Cloud

## 2019-10-27 NOTE — Discharge Summary (Signed)
Physician Discharge Summary  Patient ID: Carmen Washington MRN: [id number] DOB/AGE: 1951-05-09 69 y.o.  Admit date: 10/25/2019 Discharge date: 10/27/2019  Admission Diagnoses:  Discharge Diagnoses:  Active Problems:   Partial small bowel obstruction (HCC)   History of laparotomy   HTN (hypertension)   CKD (chronic kidney disease) stage 3, GFR 30-59 ml/min   SBO (small bowel obstruction) ([place])   Discharged Condition: fair  Hospital Course:   Brief Narrative: Carmen Washington a 69 y.o.Fwith obesity,DM, CKD 3, and multiple abdominal surgeries, most recently in 2012 who presented with acute sharp right lower quadrant pain radiating to the back with vomiting. In the ER, the patient vomited several times, creatinine 1.27, at baseline, WBC 11 K, CT abdomen pelvis showed small bowel obstruction. NG tube was placed, and the hospitalist service were asked to evaluate for SBO.  Partial small bowel obstruction (HCC)in the setting of history of multiple abdominal surgeries: Resolved -S/P contrast study with contrast now throughout the colon and return of bowel function -NG tube removed day before discharge -Started with clear liquid diet and tolerated and advance to solid diet as per surgery recommendation and plan that patient tolerated   -Status post IV hydration -Patient has had multiple bowel movement somewhat loose following p.o. intake per patient.  Spoke with surgery and reassured that it is expected for a day or 2 following resolution of bowel obstruction -Patient is a stable to be discharged home with solid diet and close follow-up with PCP  HTN (hypertension) -Blood pressure controlled -May resume antihypertensives    CKD (chronic kidney disease) stage 3, GFR 30-59 ml/min --At baseline   Consults: General surgery  Significant Diagnostic Studies: Radiology and blood work  Treatments: As per hospital course and discharge med list  Discharge Exam: Blood pressure  131/66, pulse (!) 57, temperature 98.3 F (36.8 C), temperature source Oral, resp. rate 18, height 5\' 7"  (1.702 m), weight 111.1 kg, SpO2 97 %.  Constitutional:NAD, alert and oriented x 3; all distress lying on bed. Eyes:PERRL, lids and conjunctivae normal ENMT:Mucous membranes are moist. Ng tube Neck:normal, supple, no masses, no thyromegaly Respiratory:clear to auscultation bilaterally, no wheezing, no crackles. Normal respiratory effort. No accessory muscle use.  Cardiovascular:Regular rate and rhythm, no murmurs / rubs / gallops. No extremity edema. 2+ pedal pulses. No carotid bruits.  Abdomen:RLQ tenderness, no masses palpated. No hepatosplenomegaly. Bowel sounds positive.  Musculoskeletal:no clubbing / cyanosis. No joint deformity upper and lower extremities.  Skin:no rashes, lesions, ulcers.  Neurologic:No gross focal neurologic deficit. Psychiatric:Normal mood and affect.  Disposition: Discharge disposition: 01-Home or Self Care     Warning signs and symptoms explained to patient's in detail when she must seek immediate medical attention.  Patient expressed understanding. -Advised to closely follow-up with PCP in a week Discharge Instructions    Call MD for:  redness, tenderness, or signs of infection (pain, swelling, redness, odor or green/yellow discharge around incision site)   Complete by: As directed    Diet - low sodium heart healthy   Complete by: As directed    Increase activity slowly   Complete by: As directed      Allergies as of 10/27/2019      Reactions   Codeine Nausea And Vomiting   Sulfa Antibiotics Rash   Tetracycline Rash      Medication List    TAKE these medications   acetaminophen 325 MG tablet Commonly known as: TYLENOL Take 2 tablets (650 mg total) by mouth every 6 (six) hours as  needed for mild pain (or Fever >/= 101).   ondansetron 4 MG disintegrating tablet Commonly known as: Zofran ODT Take 1 tablet (4 mg total) by mouth  every 6 (six) hours as needed for nausea or vomiting.      Follow-up Information    Marguarite Arbour, MD. Go on 11/03/2019.   Specialty: Internal Medicine Why: f/u 1:30appointment Contact information: [address] [facility] [place] [address] [phone]           Signed: Thomasenia Bottoms 10/27/2019, 2:41 PM

## 2019-11-03 DIAGNOSIS — K56609 Unspecified intestinal obstruction, unspecified as to partial versus complete obstruction: Secondary | ICD-10-CM | POA: Diagnosis not present

## 2019-11-03 DIAGNOSIS — I10 Essential (primary) hypertension: Secondary | ICD-10-CM | POA: Diagnosis not present

## 2019-11-03 DIAGNOSIS — K5900 Constipation, unspecified: Secondary | ICD-10-CM | POA: Diagnosis not present

## 2019-11-03 DIAGNOSIS — E782 Mixed hyperlipidemia: Secondary | ICD-10-CM | POA: Diagnosis not present

## 2019-11-03 DIAGNOSIS — N182 Chronic kidney disease, stage 2 (mild): Secondary | ICD-10-CM | POA: Diagnosis not present

## 2019-11-03 DIAGNOSIS — Z79899 Other long term (current) drug therapy: Secondary | ICD-10-CM | POA: Diagnosis not present

## 2020-08-08 DIAGNOSIS — H3561 Retinal hemorrhage, right eye: Secondary | ICD-10-CM | POA: Diagnosis not present

## 2020-08-08 DIAGNOSIS — H2513 Age-related nuclear cataract, bilateral: Secondary | ICD-10-CM | POA: Diagnosis not present

## 2020-08-08 DIAGNOSIS — H25013 Cortical age-related cataract, bilateral: Secondary | ICD-10-CM | POA: Diagnosis not present

## 2020-08-23 DIAGNOSIS — Z03818 Encounter for observation for suspected exposure to other biological agents ruled out: Secondary | ICD-10-CM | POA: Diagnosis not present

## 2020-09-17 DIAGNOSIS — Z03818 Encounter for observation for suspected exposure to other biological agents ruled out: Secondary | ICD-10-CM | POA: Diagnosis not present

## 2021-07-26 ENCOUNTER — Other Ambulatory Visit: Payer: Self-pay

## 2021-07-26 DIAGNOSIS — N183 Chronic kidney disease, stage 3 unspecified: Secondary | ICD-10-CM | POA: Diagnosis not present

## 2021-07-26 DIAGNOSIS — K469 Unspecified abdominal hernia without obstruction or gangrene: Secondary | ICD-10-CM | POA: Insufficient documentation

## 2021-07-26 DIAGNOSIS — R1110 Vomiting, unspecified: Secondary | ICD-10-CM | POA: Diagnosis not present

## 2021-07-26 DIAGNOSIS — R1032 Left lower quadrant pain: Secondary | ICD-10-CM | POA: Diagnosis not present

## 2021-07-26 DIAGNOSIS — K2900 Acute gastritis without bleeding: Secondary | ICD-10-CM | POA: Diagnosis not present

## 2021-07-26 DIAGNOSIS — K5730 Diverticulosis of large intestine without perforation or abscess without bleeding: Secondary | ICD-10-CM | POA: Diagnosis not present

## 2021-07-26 DIAGNOSIS — I129 Hypertensive chronic kidney disease with stage 1 through stage 4 chronic kidney disease, or unspecified chronic kidney disease: Secondary | ICD-10-CM | POA: Diagnosis not present

## 2021-07-26 DIAGNOSIS — R109 Unspecified abdominal pain: Secondary | ICD-10-CM | POA: Diagnosis not present

## 2021-07-26 DIAGNOSIS — R112 Nausea with vomiting, unspecified: Secondary | ICD-10-CM | POA: Diagnosis not present

## 2021-07-26 LAB — CBC
HCT: 46 % (ref 36.0–46.0)
Hemoglobin: 14.5 g/dL (ref 12.0–15.0)
MCH: 30.2 pg (ref 26.0–34.0)
MCHC: 31.5 g/dL (ref 30.0–36.0)
MCV: 95.8 fL (ref 80.0–100.0)
Platelets: 193 10*3/uL (ref 150–400)
RBC: 4.8 MIL/uL (ref 3.87–5.11)
RDW: 13.2 % (ref 11.5–15.5)
WBC: 6.6 10*3/uL (ref 4.0–10.5)
nRBC: 0 % (ref 0.0–0.2)

## 2021-07-26 NOTE — ED Triage Notes (Signed)
Pt with lower abd pain and emesis today. Pt with history of SBO. Pt states also has a history of hiatal hernia. Pt states is now vomiting bile.

## 2021-07-27 ENCOUNTER — Emergency Department: Payer: PPO

## 2021-07-27 ENCOUNTER — Emergency Department
Admission: EM | Admit: 2021-07-27 | Discharge: 2021-07-27 | Disposition: A | Discharge status: Home / Self Care | Payer: PPO | Attending: Emergency Medicine | Admitting: Emergency Medicine

## 2021-07-27 DIAGNOSIS — R109 Unspecified abdominal pain: Secondary | ICD-10-CM

## 2021-07-27 DIAGNOSIS — R112 Nausea with vomiting, unspecified: Secondary | ICD-10-CM | POA: Diagnosis not present

## 2021-07-27 DIAGNOSIS — R1032 Left lower quadrant pain: Secondary | ICD-10-CM | POA: Diagnosis not present

## 2021-07-27 DIAGNOSIS — K2900 Acute gastritis without bleeding: Secondary | ICD-10-CM

## 2021-07-27 DIAGNOSIS — R1110 Vomiting, unspecified: Secondary | ICD-10-CM | POA: Diagnosis not present

## 2021-07-27 LAB — COMPREHENSIVE METABOLIC PANEL
ALT: 24 U/L (ref 0–44)
AST: 31 U/L (ref 15–41)
Albumin: 4.1 g/dL (ref 3.5–5.0)
Alkaline Phosphatase: 70 U/L (ref 38–126)
Anion gap: 12 (ref 5–15)
BUN: 13 mg/dL (ref 8–23)
CO2: 22 mmol/L (ref 22–32)
Calcium: 9.3 mg/dL (ref 8.9–10.3)
Chloride: 105 mmol/L (ref 98–111)
Creatinine, Ser: 1.1 mg/dL — ABNORMAL HIGH (ref 0.44–1.00)
GFR, Estimated: 54 mL/min — ABNORMAL LOW (ref >60)
Glucose, Bld: 90 mg/dL (ref 70–99)
Potassium: 4.9 mmol/L (ref 3.5–5.1)
Sodium: 139 mmol/L (ref 135–145)
Total Bilirubin: 1.5 mg/dL — ABNORMAL HIGH (ref 0.3–1.2)
Total Protein: 7.5 g/dL (ref 6.5–8.1)

## 2021-07-27 LAB — URINALYSIS, COMPLETE (UACMP) WITH MICROSCOPIC
Bacteria, UA: NONE SEEN
Bilirubin Urine: NEGATIVE
Glucose, UA: NEGATIVE mg/dL
Hgb urine dipstick: NEGATIVE
Ketones, ur: NEGATIVE mg/dL
Leukocytes,Ua: NEGATIVE
Nitrite: NEGATIVE
Protein, ur: NEGATIVE mg/dL
Specific Gravity, Urine: 1.008 (ref 1.005–1.030)
pH: 7 (ref 5.0–8.0)

## 2021-07-27 MED ORDER — IOHEXOL 300 MG/ML  SOLN
100.0000 mL | Freq: Once | INTRAMUSCULAR | Status: AC | PRN
  Administered 2021-07-27: 100 mL via INTRAVENOUS

## 2021-07-27 MED ORDER — ONDANSETRON HCL 4 MG/2ML IJ SOLN
4.0000 mg | INTRAMUSCULAR | Status: AC
  Administered 2021-07-27: 4 mg via INTRAVENOUS
  Filled 2021-07-27: qty 2

## 2021-07-27 MED ORDER — DICYCLOMINE HCL 10 MG PO CAPS: 10.0000 mg | ORAL_CAPSULE | Freq: Three times a day (TID) | ORAL | 0 refills | Status: DC

## 2021-07-27 MED ORDER — ONDANSETRON HCL 4 MG/2ML IJ SOLN
INTRAMUSCULAR | Status: AC
  Filled 2021-07-27: qty 2

## 2021-07-27 MED ORDER — ONDANSETRON HCL 4 MG/2ML IJ SOLN
4.0000 mg | Freq: Once | INTRAMUSCULAR | Status: AC | PRN
  Administered 2021-07-27: 4 mg via INTRAVENOUS

## 2021-07-27 MED ORDER — ONDANSETRON 4 MG PO TBDP: 4.0000 mg | ORAL_TABLET | Freq: Four times a day (QID) | ORAL | 0 refills | Status: DC | PRN

## 2021-07-27 MED ORDER — DICYCLOMINE HCL 10 MG PO CAPS
10.0000 mg | ORAL_CAPSULE | Freq: Once | ORAL | Status: AC
  Administered 2021-07-27: 10 mg via ORAL
  Filled 2021-07-27: qty 1

## 2021-07-27 MED ORDER — MORPHINE SULFATE (PF) 4 MG/ML IV SOLN
4.0000 mg | Freq: Once | INTRAVENOUS | Status: AC
  Administered 2021-07-27: 4 mg via INTRAVENOUS
  Filled 2021-07-27: qty 1

## 2021-07-27 MED ORDER — SODIUM CHLORIDE 0.9 % IV BOLUS
500.0000 mL | Freq: Once | INTRAVENOUS | Status: AC
  Administered 2021-07-27: 500 mL via INTRAVENOUS

## 2021-07-27 NOTE — ED Notes (Signed)
Quale MD at bedside 

## 2021-07-27 NOTE — ED Notes (Signed)
Patient O2 in mid 80's after receiving morphine. Patient placed on 2L [place] with O2 now at 95%.

## 2021-07-27 NOTE — Discharge Instructions (Addendum)
You were seen in the emergency room for abdominal pain. It is important that you follow up closely with your primary care doctor in the next 2-3 days.   Please return to the emergency room right away if you are to develop a fever, severe nausea, your pain becomes severe or worsens, you are unable to keep food down, begin vomiting any dark or bloody fluid, you develop any dark or bloody stools, feel dehydrated, or other new concerns or symptoms arise.

## 2021-07-27 NOTE — ED Provider Notes (Signed)
[facility] Emergency Department Provider Note   ____________________________________________   Event Date/Time   First MD Initiated Contact with Patient 07/27/21 [phone]     (approximate)  I have reviewed the triage vital signs and the nursing notes.   HISTORY  Chief Complaint Abdominal Pain    HPI Carmen Washington is a 70 y.o. female history of multiple complex bowel surgeries in the past.  Also  chronic kidney disease.  Thursday started developing sharp abdominal pain that radiates towards the left lower back.  Also feels that her abdomen felt swollen very painful, she vomited multiple times very "green" emesis yesterday.  Abdomen is more swollen than usual and feels very concerning for similar to previous bowel blockages  She has not had any bowel movement or passed gas since Thursday.  She reports nausea medicine helped here earlier but nausea started to return she has no desire to eat.  Not eating hardly anything for the last 2 days anything she did attempt to eat she would vomit  No chest pain no trouble breathing.  Pain is currently moderate to severe located in the mid to left lower abdomen  Past Medical History:  Diagnosis Date   Hiatal hernia    Hypertension    Incisional hernia     Patient Active Problem List   Diagnosis Date Noted   Partial small bowel obstruction (HCC) 10/25/2019   History of laparotomy 10/25/2019   HTN (hypertension) 10/25/2019   CKD (chronic kidney disease) stage 3, GFR 30-59 ml/min (HCC) 10/25/2019   SBO (small bowel obstruction) (HCC) 10/25/2019    Past Surgical History:  Procedure Laterality Date   CHOLECYSTECTOMY     DEBRIDEMENT AND CLOSURE WOUND     HERNIA REPAIR     TOTAL HIP ARTHROPLASTY      Prior to Admission medications   Medication Sig Start Date End Date Taking? Authorizing Provider  dicyclomine (BENTYL) 10 MG capsule Take 1 capsule (10 mg total) by mouth 4 (four) times daily -  before meals  and at bedtime. 07/27/21  Yes Sharyn Creamer, MD  ondansetron (ZOFRAN ODT) 4 MG disintegrating tablet Take 1 tablet (4 mg total) by mouth every 6 (six) hours as needed for nausea or vomiting. 07/27/21  Yes Sharyn Creamer, MD  acetaminophen (TYLENOL) 325 MG tablet Take 2 tablets (650 mg total) by mouth every 6 (six) hours as needed for mild pain (or Fever >/= 101). 10/27/19   Thomasenia Bottoms, MD  ondansetron (ZOFRAN ODT) 4 MG disintegrating tablet Take 1 tablet (4 mg total) by mouth every 6 (six) hours as needed for nausea or vomiting. 10/27/19   Thomasenia Bottoms, MD    Allergies Codeine, Sulfa antibiotics, and Tetracycline  Family History  Problem Relation Age of Onset   Breast cancer Cousin     Social History Social History   Tobacco Use   Smoking status: Never   Smokeless tobacco: Never  Substance Use Topics   Alcohol use: Never   Drug use: Never    Review of Systems Constitutional: No fever/chills Eyes: No visual changes. ENT: No sore throat. Cardiovascular: Denies chest pain. Respiratory: Denies shortness of breath. Gastrointestinal: No abdominal pain.   Genitourinary: Negative for dysuria. Musculoskeletal: Negative for back pain. Skin: Negative for rash. Neurological: Negative for headaches, areas of focal weakness or numbness.    ____________________________________________   PHYSICAL EXAM:  VITAL SIGNS: ED Triage Vitals  Enc Vitals Group     BP 07/26/21 2330 (!) 177/99  Pulse Rate 07/26/21 2330 85     Resp 07/26/21 2330 18     Temp 07/26/21 2330 98.2 F (36.8 C)     Temp Source 07/26/21 2330 Oral     SpO2 07/26/21 2330 100 %     Weight 07/26/21 2331 245 lb (111.1 kg)     Height 07/26/21 2331 5\' 7"  (1.702 m)     Head Circumference --      Peak Flow --      Pain Score 07/26/21 2334 10     Pain Loc --      Pain Edu? --      Excl. in GC? --     Constitutional: Alert and oriented. Well appearing and in no acute distress. Eyes: Conjunctivae are normal. Head:  Atraumatic. Nose: No congestion/rhinnorhea. Mouth/Throat: Mucous membranes are moist. Neck: No stridor.  Cardiovascular: Normal rate, regular rhythm. Grossly normal heart sounds.  Good peripheral circulation. Respiratory: Normal respiratory effort.  No retractions. Lungs CTAB. Gastrointestinal: Soft and early protuberant, has large anterior abdominal wall hernia, and she reports tenderness across the mid to the primarily left lower to the left mid abdomen.  She does report some referred pain to the left abdomen when pressing upon the right. No distention. Musculoskeletal: No lower extremity tenderness nor edema. Neurologic:  Normal speech and language. No gross focal neurologic deficits are appreciated.  Skin:  Skin is warm, dry and intact. No rash noted. Psychiatric: Mood and affect are normal. Speech and behavior are normal.  ____________________________________________   LABS (all labs ordered are listed, but only abnormal results are displayed)  Labs Reviewed  COMPREHENSIVE METABOLIC PANEL - Abnormal; Notable for the following components:      Result Value   Creatinine, Ser 1.10 (*)    Total Bilirubin 1.5 (*)    GFR, Estimated 54 (*)    All other components within normal limits  URINALYSIS, COMPLETE (UACMP) WITH MICROSCOPIC - Abnormal; Notable for the following components:   Color, Urine STRAW (*)    APPearance CLEAR (*)    All other components within normal limits  CBC   ________________________________________  RADIOLOGY  CT Abdomen Pelvis W Contrast  Result Date: 07/27/2021 CLINICAL DATA:  Abdominal pain with nausea and vomiting for several hours EXAM: CT ABDOMEN AND PELVIS WITH CONTRAST TECHNIQUE: Multidetector CT imaging of the abdomen and pelvis was performed using the standard protocol following bolus administration of intravenous contrast. CONTRAST:  07/29/2021 OMNIPAQUE IOHEXOL 300 MG/ML  SOLN COMPARISON:  10/25/2019 FINDINGS: Lower chest: No acute abnormality.  Hepatobiliary: Mild fatty infiltration of the liver is noted. Gallbladder has been surgically removed. Pancreas: Unremarkable. No pancreatic ductal dilatation or surrounding inflammatory changes. Spleen: Normal in size without focal abnormality. Adrenals/Urinary Tract: Adrenal glands are within normal limits. Right kidney demonstrates a normal enhancement pattern. Left kidney appears to have been surgically removed. The bladder is decompressed. Stomach/Bowel: Scattered diverticular change of the colon is noted without definitive diverticulitis. The appendix is not well visualized. No inflammatory changes to suggest appendicitis are noted. Small bowel and stomach are within normal limits. Vascular/Lymphatic: Aortic atherosclerosis. No enlarged abdominal or pelvic lymph nodes. Reproductive: Status post hysterectomy. No adnexal masses. Other: Low anterior abdominal wall hernia is noted with multiple small bowel loops within. The overall appearance is similar to that seen on the prior exam. No evidence of incarceration is noted. Musculoskeletal: Bilateral hip replacements are seen. Postsurgical changes in the lumbar spine are seen. IMPRESSION: Stable anterior abdominal wall hernia with multiple bowel loops within.  No incarceration is noted. Diverticulosis without diverticulitis. Fatty liver. Electronically Signed   By: Alcide Clever M.D.   On: 07/27/2021 02:14    CT imaging reviewed negative for evidence of acute disease.  CT imaging also reviewed in conjunction with Dr. Claudine Mouton (surg) ____________________________________________   PROCEDURES  Procedure(s) performed: None  Procedures  Critical Care performed: No  ____________________________________________   INITIAL IMPRESSION / ASSESSMENT AND PLAN / ED COURSE  Pertinent labs & imaging results that were available during my care of the patient were reviewed by me and considered in my medical decision making (see chart for details).  Differential  diagnosis includes but is not limited to, abdominal perforation, aortic dissection, cholecystitis, appendicitis, diverticulitis, colitis, esophagitis/gastritis, kidney stone, pyelonephritis, urinary tract infection, aortic aneurysm. All are considered in decision and treatment plan. Based upon the patient's presentation and risk factors, and her symptomatology I have concern for potential bowel obstruction, or other acute intra-abdominal pathology.  Patient CT imaging is overall reassuring without obvious evidence of obstruction, however her clinical history anorexia, nausea, vomiting of dark emesis, and ongoing nature of her pain is concerning for acute intra-abdominal pathology with possible obstruction high in the list.  Dr. Claudine Mouton discussed case with me and he reviewed the patient's CT imaging and clinical history.  He advises that there is no evidence support obvious obstructive pathology.  Would be reasonable to trial eating/p.o. challenge and if patient is able to tolerate would be reasonable to discharge with follow-up, if patient unable to tolerate by mouth or condition not improving with be reasonable to admit to medical services with surgical consult   Patient able to tolerate by mouth well symptoms improving, able to eat several crackers, drink 3 juice containers, and feeling improved.  Bentyl seem to help her symptoms.  She appears well awake alert no distress feeling improved.  Appropriate for discharge to home.  Careful return precautions advised.  Return precautions and treatment recommendations and follow-up discussed with the patient who is agreeable with the plan.        ____________________________________________   FINAL CLINICAL IMPRESSION(S) / ED DIAGNOSES  Final diagnoses:  Acute gastritis without hemorrhage, unspecified gastritis type  Abdominal pain, unspecified abdominal location        Note:  This document was prepared using Dragon voice recognition  software and may include unintentional dictation errors       Sharyn Creamer, MD 07/27/21 1608

## 2021-07-31 DIAGNOSIS — M48061 Spinal stenosis, lumbar region without neurogenic claudication: Secondary | ICD-10-CM | POA: Diagnosis not present

## 2021-07-31 DIAGNOSIS — M5442 Lumbago with sciatica, left side: Secondary | ICD-10-CM | POA: Diagnosis not present

## 2021-07-31 DIAGNOSIS — M545 Low back pain, unspecified: Secondary | ICD-10-CM | POA: Diagnosis not present

## 2021-07-31 DIAGNOSIS — Z96649 Presence of unspecified artificial hip joint: Secondary | ICD-10-CM | POA: Diagnosis not present

## 2021-08-02 ENCOUNTER — Other Ambulatory Visit: Payer: Self-pay | Admitting: Orthopedic Surgery

## 2021-08-02 DIAGNOSIS — M5442 Lumbago with sciatica, left side: Secondary | ICD-10-CM

## 2021-08-16 ENCOUNTER — Ambulatory Visit: Payer: PPO

## 2024-03-21 DIAGNOSIS — K439 Ventral hernia without obstruction or gangrene: Secondary | ICD-10-CM | POA: Insufficient documentation

## 2024-03-21 DIAGNOSIS — E785 Hyperlipidemia, unspecified: Secondary | ICD-10-CM | POA: Insufficient documentation

## 2024-03-22 ENCOUNTER — Ambulatory Visit: Admitting: Family Medicine

## 2024-03-22 ENCOUNTER — Encounter: Payer: Self-pay | Admitting: Family Medicine

## 2024-03-22 ENCOUNTER — Ambulatory Visit
Admission: RE | Admit: 2024-03-22 | Discharge: 2024-03-22 | Disposition: A | Discharge status: Home / Self Care | Source: Ambulatory Visit | Attending: Family Medicine

## 2024-03-22 ENCOUNTER — Ambulatory Visit
Admission: RE | Admit: 2024-03-22 | Discharge: 2024-03-22 | Disposition: A | Discharge status: Home / Self Care | Attending: Family Medicine | Admitting: Family Medicine

## 2024-03-22 VITALS — BP 130/82 | HR 84 | Resp 16 | Ht 67.0 in | Wt 234.0 lb

## 2024-03-22 DIAGNOSIS — Z13820 Encounter for screening for osteoporosis: Secondary | ICD-10-CM

## 2024-03-22 DIAGNOSIS — E782 Mixed hyperlipidemia: Secondary | ICD-10-CM | POA: Diagnosis not present

## 2024-03-22 DIAGNOSIS — Z1231 Encounter for screening mammogram for malignant neoplasm of breast: Secondary | ICD-10-CM

## 2024-03-22 DIAGNOSIS — N1832 Chronic kidney disease, stage 3b: Secondary | ICD-10-CM | POA: Diagnosis not present

## 2024-03-22 DIAGNOSIS — Z111 Encounter for screening for respiratory tuberculosis: Secondary | ICD-10-CM

## 2024-03-22 DIAGNOSIS — E66812 Obesity, class 2: Secondary | ICD-10-CM | POA: Diagnosis not present

## 2024-03-22 DIAGNOSIS — Z0000 Encounter for general adult medical examination without abnormal findings: Secondary | ICD-10-CM

## 2024-03-22 DIAGNOSIS — Z6836 Body mass index (BMI) 36.0-36.9, adult: Secondary | ICD-10-CM | POA: Diagnosis not present

## 2024-03-22 DIAGNOSIS — Z789 Other specified health status: Secondary | ICD-10-CM | POA: Diagnosis not present

## 2024-03-22 DIAGNOSIS — N1831 Chronic kidney disease, stage 3a: Secondary | ICD-10-CM

## 2024-03-22 DIAGNOSIS — I10 Essential (primary) hypertension: Secondary | ICD-10-CM

## 2024-03-22 DIAGNOSIS — Z1211 Encounter for screening for malignant neoplasm of colon: Secondary | ICD-10-CM

## 2024-03-22 DIAGNOSIS — Z0289 Encounter for other administrative examinations: Secondary | ICD-10-CM

## 2024-03-22 NOTE — Assessment & Plan Note (Signed)
 Recheck renal function today Discussed efforts to support renal function - keep BP controlled, stay hydrated, avoid NSAIDs and nephrotoxic meds/substances

## 2024-03-22 NOTE — Assessment & Plan Note (Signed)
 Cormorbidities include CKD, HTN, HLD, OA

## 2024-03-22 NOTE — Progress Notes (Signed)
 Name: Carmen Washington   MRN: [id number]    DOB: July 24, 1951   Date:03/22/2024       Progress Note  Chief Complaint  Patient presents with   Establish Care    Discuss application needs   Hypertension    Subjective:   Carmen Washington is a 73 y.o. female, presents to clinic for routine follow up on chronic conditions  Here to est care Needs screening for traveling internationally, she is intending to move to [place] for a stay longer than 90 d she has paperwork with her that vaguely states radiology and medical exam without any specific forms and needs Currently not on any meds and no recent primary care Last was [facility] years ago   Reviewed her medical and surgical hx, she is not really interested in labs and any cancer screenings, she only made the appt today to get necessary letter or documentation for her move to [place].  She otherwise states she "believe what the bible says" and :regardless of her cholesterol or risk of heart attach or stroke "Jesus saved her" and "God will tell her what to do"  These types of answers are in direct response to questions I asked such as " if you have a UTI or pneumonia would you take medication to treat the infection?"  "If your cholesterol was still high and your risk of a heart attack and stroke were high and cholesterol medicines were recommended would you ever consider taking medications?"  And her answers are not very clear.  She never did specifically state if regardless or medical circumstance - ie an acute infection that is treatable, or preventative medicine like mananaging HTN or reducing ASCVD risk with high cholesterol and statins - if she would ever take medicines or not. She did say she declines and refuses any current due or recommended cancer screenings and refused/declined dexa  I explained that in order to write up any kind of examination or assessment stating anything related to her general health I would  want basic labs at least once. We will also do TB screening labs and CXR per her request.  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 Renal function recent labs: GFR est 10/14/022 54 in [facility] everywhere shows 11/03/2019 eGFR 60 Lab Results  Component Value Date   GFRAA 47 (L) 10/27/2019   GFRAA 50 (L) 10/26/2019   GFRAA 50 (L) 10/25/2019    Lab Results  Component Value Date   CREATININE 1.10 (H) 07/26/2021   BUN 13 07/26/2021   NA 139 07/26/2021   K 4.9 07/26/2021   CL 105 07/26/2021   CO2 22 07/26/2021   HLD - lipids last in [facility] 2018 Total cholesterol 255, trigs 236, HDL 33.9, LDL 174     03/22/2024   10:36 AM 09/01/2017   11:12 AM  Depression screen PHQ 2/9  Decreased Interest 0 0  Down, Depressed, Hopeless 0 0  PHQ - 2 Score 0 0     SDOH Screenings   Food Insecurity: No Food Insecurity (03/22/2024)  Housing: Unknown (03/22/2024)  Transportation Needs: No Transportation Needs (03/22/2024)  Utilities: Not At Risk (03/22/2024)  Alcohol Screen: Low Risk  (03/22/2024)  Depression (PHQ2-9): Low Risk  (03/22/2024)  Financial Resource Strain:  Low Risk  (03/22/2024)  Physical Activity: Inactive (03/22/2024)  Social Connections: Moderately Isolated (03/22/2024)  Stress: No Stress Concern Present (03/22/2024)  Tobacco Use: Medium Risk (03/22/2024)  Health Literacy: Adequate Health Literacy (03/22/2024)    No current outpatient medications on file.  Patient Active Problem List   Diagnosis Date Noted   History of religious or cultural beliefs affecting care 03/22/2024   Class 2 severe obesity with serious comorbidity and body mass index (BMI) of 36.0 to 36.9 in adult [facility]) 03/22/2024   Hyperlipidemia 03/21/2024   History of laparotomy 10/25/2019   HTN (hypertension) 10/25/2019   CKD (chronic kidney  disease) stage 3, GFR 30-59 ml/min (HCC) 10/25/2019   Single kidney 07/08/2012    Past Surgical History:  Procedure Laterality Date   ABDOMINAL HYSTERECTOMY     in 1980's in her 30's   BILATERAL OOPHORECTOMY Bilateral    in 80's or 90's due to suspected cancer, no cancer found   CHOLECYSTECTOMY     COLON SURGERY     DEBRIDEMENT AND CLOSURE WOUND     HERNIA REPAIR     left nephrectomy     donated kidney   PARTIAL COLECTOMY     SPINE SURGERY     TOTAL HIP ARTHROPLASTY     left 08/2022, right 09/2022    Family History  Problem Relation Age of Onset   Cancer Mother        lung   Kidney disease Father    Hypertension Father    Breast cancer Cousin    Social History   Socioeconomic History   Marital status: Divorced    Spouse name: Not on file   Number of children: Not on file   Years of education: Not on file   Highest education level: Not on file  Occupational History   Not on file  Tobacco Use   Smoking status: Former    Types: Cigarettes   Smokeless tobacco: Never   Tobacco comments:    Former smoker in 1970's, she smoked less than 1 ppd and cannot tell me number of years  Vaping Use   Vaping status: Never Used  Substance and Sexual Activity   Alcohol use: Never   Drug use: Never   Sexual activity: Never  Other Topics Concern   Not on file  Social History Narrative   Not on file   Social Drivers of Health   Financial Resource Strain: Low Risk  (03/22/2024)   Overall Financial Resource Strain (CARDIA)    Difficulty of Paying Living Expenses: Not hard at all  Food Insecurity: No Food Insecurity (03/22/2024)   Hunger Vital Sign    Worried About Running Out of Food in the Last Year: Never true    Ran Out of Food in the Last Year: Never true  Transportation Needs: No Transportation Needs (03/22/2024)   PRAPARE - Administrator, Civil Service (Medical): No    Lack of Transportation (Non-Medical): No  Physical Activity: Inactive (03/22/2024)    Exercise Vital Sign    Days of Exercise per Week: 0 days    Minutes of Exercise per Session: 0 min  Stress: No Stress Concern Present (03/22/2024)   [employer] of Occupational Health - Occupational Stress Questionnaire    Feeling of Stress : Not at all  Social Connections: Moderately Isolated (03/22/2024)   Social Connection and Isolation Panel [NHANES]    Frequency of Communication with Friends and Family: More than three times a week    Frequency  of Social Gatherings with Friends and Family: More than three times a week    Attends Religious Services: More than 4 times per year    Active Member of Clubs or Organizations: No    Attends Banker Meetings: Never    Marital Status: Divorced       Allergies  Allergen Reactions   Codeine Nausea And Vomiting   Sulfa Antibiotics Rash   Tetracycline Rash    Health Maintenance  Topic Date Due   COVID-19 Vaccine (4 - 2024-25 season) 04/06/2024 (Originally 06/14/2023)   Medicare Annual Wellness (AWV)  04/21/2024 (Originally 03/25/2018)   Zoster Vaccines- Shingrix (1 of 2) 06/21/2024 (Originally 07/30/1999)   DTaP/Tdap/Td (1 - Tdap) 03/22/2025 (Originally 07/29/1968)   MAMMOGRAM  03/22/2025 (Originally 10/07/2020)   DEXA SCAN  03/22/2025 (Originally 07/29/2014)   Colonoscopy  03/22/2025 (Originally 07/29/1994)   INFLUENZA VACCINE  05/13/2024   Pneumonia Vaccine 104+ Years old  Completed   Hepatitis C Screening  Completed   HPV VACCINES  Aged Out   Meningococcal B Vaccine  Aged Out   Immunization History  Administered Date(s) Administered   Hep A / Hep B 07/08/2012, 08/06/2012, 01/27/2013   IPV 07/08/2012   Influenza, Seasonal, Injecte, Preservative Fre 10/01/2007   PFIZER(Purple Top)SARS-COV-2 Vaccination 11/18/2019, 12/09/2019, 07/09/2020   PNEUMOCOCCAL CONJUGATE-20 07/24/2021   Typhoid Inactivated 07/08/2012   Yellow Fever 07/08/2012   Per [facility] my[facility] pt has had hep c screening previously in 2003 and HIV  screening   Chart Review Today: I personally reviewed active problem list, medication list, allergies, family history, social history, health maintenance, notes from last encounter, lab results, imaging with the patient/caregiver today.   Review of Systems  Constitutional: Negative.   HENT: Negative.    Eyes: Negative.   Respiratory: Negative.    Cardiovascular: Negative.   Gastrointestinal: Negative.   Endocrine: Negative.   Genitourinary: Negative.   Musculoskeletal: Negative.   Skin: Negative.   Allergic/Immunologic: Negative.   Neurological: Negative.   Hematological: Negative.   Psychiatric/Behavioral: Negative.    All other systems reviewed and are negative.    Objective:   Vitals:   03/22/24 1031  BP: 130/82  Pulse: 84  Resp: 16  SpO2: 98%  Weight: 234 lb (106.1 kg)  Height: 5\' 7"  (1.702 m)    Body mass index is 36.65 kg/m.  Physical Exam Vitals and nursing note reviewed.  Constitutional:      General: She is not in acute distress.    Appearance: Normal appearance. She is well-developed. She is obese. She is not ill-appearing, toxic-appearing or diaphoretic.  HENT:     Head: Normocephalic and atraumatic.     Right Ear: External ear normal.     Left Ear: External ear normal.     Nose: Nose normal.  Eyes:     General: No scleral icterus.       Right eye: No discharge.        Left eye: No discharge.     Conjunctiva/sclera: Conjunctivae normal.  Neck:     Trachea: No tracheal deviation.  Cardiovascular:     Rate and Rhythm: Normal rate and regular rhythm.     Pulses: Normal pulses.     Heart sounds: Normal heart sounds.  Pulmonary:     Effort: Pulmonary effort is normal. No respiratory distress.     Breath sounds: Normal breath sounds. No stridor.  Skin:    General: Skin is warm and dry.     Findings: No rash.  Neurological:     Mental Status: She is alert.     Motor: No abnormal muscle tone.     Coordination: Coordination normal.     Gait:  Gait normal.  Psychiatric:        Mood and Affect: Mood normal.        Behavior: Behavior normal.      Functional Status Survey: Is the patient deaf or have difficulty hearing?: No Does the patient have difficulty seeing, even when wearing glasses/contacts?: No Does the patient have difficulty concentrating, remembering, or making decisions?: No Does the patient have difficulty walking or climbing stairs?: No Does the patient have difficulty dressing or bathing?: No Does the patient have difficulty doing errands alone such as visiting a doctor's office or shopping?: No     Assessment & Plan:   Problem List Items Addressed This Visit     HTN (hypertension)   Not currently on meds, BP near goal with diet/lifestyle efforts BP Readings from Last 3 Encounters:  03/22/24 130/82  07/27/21 117/73  10/27/19 (!) 136/58        Relevant Orders   Comprehensive Metabolic Panel (CMET)   CKD (chronic kidney disease) stage 3, GFR 30-59 ml/min (HCC)   Recheck renal function today Discussed efforts to support renal function - keep BP controlled, stay hydrated, avoid NSAIDs and nephrotoxic meds/substances      Relevant Orders   Comprehensive Metabolic Panel (CMET)   Hyperlipidemia   Reviewed her past labs with very high lipids and reviewed how we currently will recommend statins with known ASCVD disease, DM, hx of events, moderate to high ASCVD risk or with other factors like very elevated LDL Pt is very vague with answers about her priorities with health and weather or not she would consider taking any medications if they were medically recommended. I advised doing a lipid panel and then I can get her information and medical recommendations and it would be up to her to make informed autonomous medical decisions in light of her faith/beliefs etc. Suspect lipids and ASCVD to be high and that statins will be recommended and pt will be given all info and recommendations and then would understand  and accept the risk of stroke/MI etc, which she states is more up to God than medicine or tests.      Relevant Orders   Comprehensive Metabolic Panel (CMET)   Lipid Profile   History of religious or cultural beliefs affecting care   Very difficult to get an understanding of how pt's beliefs and faith would affect her priorities when making health care decisions.  She stated many times its based on "what the bible says" what "God tells her to do" I explained that understanding her values and priorities can help me frame patient centered care while doing evidence based medicine.   In the end after much discussion I concluded to her that I would require the basic labs and tests today for the letters and forms she wishes for me to complete and I will share with her all the results, risk scores and medical recommendations and then afterwards its up to her to decide what to do and if she chooses to go against medical recommendations then at least she was informed and knew the risks prior to making those decisions.        Class 2 severe obesity with serious comorbidity and body mass index (BMI) of 36.0 to 36.9 in adult [facility])   Cormorbidities include CKD, HTN, HLD, OA  Other Visit Diagnoses       Encounter for medical examination to establish care    -  Primary   Relevant Orders   CBC with Differential/Platelet   Comprehensive Metabolic Panel (CMET)   Lipid Profile     Tuberculosis screening       no current sx or recent travel or exposure   Relevant Orders   QuantiFERON-TB Gold Plus   DG Chest 2 View     Encounter for physical examination       Relevant Orders   CBC with Differential/Platelet   Comprehensive Metabolic Panel (CMET)   Lipid Profile     Encounter for completion of form with patient       Relevant Orders   CBC with Differential/Platelet   Comprehensive Metabolic Panel (CMET)   Lipid Profile      Health Maintenance  Topic Date Due   COVID-19 Vaccine (4 - 2024-25  season) 04/06/2024*   Medicare Annual Wellness Visit  04/21/2024*   Zoster (Shingles) Vaccine (1 of 2) 06/21/2024*   DTaP/Tdap/Td vaccine (1 - Tdap) 03/22/2025*   Mammogram  03/22/2025*   DEXA scan (bone density measurement)  03/22/2025*   Colon Cancer Screening  03/22/2025*   Flu Shot  05/13/2024   Pneumonia Vaccine  Completed   Hepatitis C Screening  Completed   HPV Vaccine  Aged Out   Meningitis B Vaccine  Aged Out  *Topic was postponed. The date shown is not the original due date.   Pt further refused all preventative medicine, testing, cancer screening and MWV  F/up pending results  Adeline Hone, PA-C 03/22/24 6:44 PM

## 2024-03-22 NOTE — Assessment & Plan Note (Signed)
 Not currently on meds, BP near goal with diet/lifestyle efforts BP Readings from Last 3 Encounters:  03/22/24 130/82  07/27/21 117/73  10/27/19 (!) 136/58

## 2024-03-22 NOTE — Assessment & Plan Note (Signed)
 Very difficult to get an understanding of how pt's beliefs and faith would affect her priorities when making health care decisions.  She stated many times its based on "what the bible says" what "God tells her to do" I explained that understanding her values and priorities can help me frame patient centered care while doing evidence based medicine.   In the end after much discussion I concluded to her that I would require the basic labs and tests today for the letters and forms she wishes for me to complete and I will share with her all the results, risk scores and medical recommendations and then afterwards its up to her to decide what to do and if she chooses to go against medical recommendations then at least she was informed and knew the risks prior to making those decisions.

## 2024-03-22 NOTE — Assessment & Plan Note (Signed)
 Reviewed her past labs with very high lipids and reviewed how we currently will recommend statins with known ASCVD disease, DM, hx of events, moderate to high ASCVD risk or with other factors like very elevated LDL Pt is very vague with answers about her priorities with health and weather or not she would consider taking any medications if they were medically recommended. I advised doing a lipid panel and then I can get her information and medical recommendations and it would be up to her to make informed autonomous medical decisions in light of her faith/beliefs etc. Suspect lipids and ASCVD to be high and that statins will be recommended and pt will be given all info and recommendations and then would understand and accept the risk of stroke/MI etc, which she states is more up to God than medicine or tests.

## 2024-03-25 ENCOUNTER — Ambulatory Visit: Payer: Self-pay | Admitting: Family Medicine

## 2024-03-25 ENCOUNTER — Encounter: Payer: Self-pay | Admitting: Family Medicine

## 2024-03-26 LAB — QUANTIFERON-TB GOLD PLUS
Mitogen-NIL: 8.3 [IU]/mL
NIL: 0.02 [IU]/mL
QuantiFERON-TB Gold Plus: NEGATIVE
TB1-NIL: 0.01 [IU]/mL
TB2-NIL: 0.01 [IU]/mL

## 2024-03-26 LAB — CBC WITH DIFFERENTIAL/PLATELET
Absolute Lymphocytes: 1482 {cells}/uL (ref 850–3900)
Absolute Monocytes: 429 {cells}/uL (ref 200–950)
Basophils Absolute: 33 {cells}/uL (ref 0–200)
Basophils Relative: 0.5 %
Eosinophils Absolute: 98 {cells}/uL (ref 15–500)
Eosinophils Relative: 1.5 %
HCT: 42.1 % (ref 35.0–45.0)
Hemoglobin: 13.4 g/dL (ref 11.7–15.5)
MCH: 29.8 pg (ref 27.0–33.0)
MCHC: 31.8 g/dL — ABNORMAL LOW (ref 32.0–36.0)
MCV: 93.8 fL (ref 80.0–100.0)
MPV: 11.2 fL (ref 7.5–12.5)
Monocytes Relative: 6.6 %
Neutro Abs: 4459 {cells}/uL (ref 1500–7800)
Neutrophils Relative %: 68.6 %
Platelets: 178 10*3/uL (ref 140–400)
RBC: 4.49 10*6/uL (ref 3.80–5.10)
RDW: 13.2 % (ref 11.0–15.0)
Total Lymphocyte: 22.8 %
WBC: 6.5 10*3/uL (ref 3.8–10.8)

## 2024-03-26 LAB — COMPREHENSIVE METABOLIC PANEL WITH GFR
AG Ratio: 1.5 (calc) (ref 1.0–2.5)
ALT: 14 U/L (ref 6–29)
AST: 13 U/L (ref 10–35)
Albumin: 3.9 g/dL (ref 3.6–5.1)
Alkaline phosphatase (APISO): 70 U/L (ref 37–153)
BUN/Creatinine Ratio: 15 (calc) (ref 6–22)
BUN: 20 mg/dL (ref 7–25)
CO2: 31 mmol/L (ref 20–32)
Calcium: 9.3 mg/dL (ref 8.6–10.4)
Chloride: 108 mmol/L (ref 98–110)
Creat: 1.35 mg/dL — ABNORMAL HIGH (ref 0.60–1.00)
Globulin: 2.6 g/dL (ref 1.9–3.7)
Glucose, Bld: 93 mg/dL (ref 65–99)
Potassium: 4.7 mmol/L (ref 3.5–5.3)
Sodium: 144 mmol/L (ref 135–146)
Total Bilirubin: 0.7 mg/dL (ref 0.2–1.2)
Total Protein: 6.5 g/dL (ref 6.1–8.1)
eGFR: 41 mL/min/{1.73_m2} — ABNORMAL LOW (ref >=60)

## 2024-03-26 LAB — LIPID PANEL
Cholesterol: 238 mg/dL — ABNORMAL HIGH (ref <200)
HDL: 36 mg/dL — ABNORMAL LOW (ref >=50)
LDL Cholesterol (Calc): 167 mg/dL — ABNORMAL HIGH
Non-HDL Cholesterol (Calc): 202 mg/dL — ABNORMAL HIGH (ref <130)
Total CHOL/HDL Ratio: 6.6 (calc) — ABNORMAL HIGH (ref <5.0)
Triglycerides: 190 mg/dL — ABNORMAL HIGH (ref <150)

## 2024-03-28 MED ORDER — ROSUVASTATIN CALCIUM 5 MG PO TABS: 5.0000 mg | ORAL_TABLET | Freq: Every day | ORAL | 3 refills | Status: AC

## 2024-03-28 MED ORDER — LOSARTAN POTASSIUM 25 MG PO TABS: 25.0000 mg | ORAL_TABLET | Freq: Every day | ORAL | 1 refills | Status: AC

## 2024-03-28 NOTE — Addendum Note (Signed)
 Addended by: Egan Sahlin on: 03/28/2024 10:26 AM   Modules accepted: Orders

## 2024-03-28 NOTE — Progress Notes (Signed)
 I sent in crestor for her HLD/ASCVD risk if she decides to start that medication it is at [facility] And for protecting her renal function I sent in low dose losartan and nephrology referral.   Please just document that pt was given info and reasons for medical recommendations and document if she refuses.  Thank you

## 2024-03-28 NOTE — Progress Notes (Signed)
 See result notes - Letter drafted for pt with general statement about her being examined and UTD on vaccines and neg TB screening. Pt intending to use info for when she moves to [place].  No specific forms were asked to be filled out.  She has all her lab and xray results and letter (the will be mailed to her).  I did not include anything about her other chronic health conditions Medical recommendations is to consult with nephrology re worsening renal function, start losartan and for HLD and high ASCVD risk score statin is recommended and crestor 5 was prescribed as well. CMA was asked to document if she refuses medical recommendations or if she is willing to follow.  Adeline Hone, PA-C

## 2024-03-28 NOTE — Progress Notes (Signed)
 Please let pt know about lab results- Renal function is worse and we would refer her to nephrologist with her current eGFR (stage 3 b CKD) and cholesterol is very high, with ASCVD risk score 16.2% and statin medication is indicated to lower cholesterol and lower risk of a major event.   The 10-year ASCVD risk score (Arnett DK, et al., 2019) is: 16.2%   Values used to calculate the score:     Age: 73 years     Clincally relevant sex: Female     Is Non-Hispanic African American: Yes     Diabetic: No     Tobacco smoker: No     Systolic Blood Pressure: 130 mmHg     Is BP treated: No     HDL Cholesterol: 36 mg/dL     Total Cholesterol: 238 mg/dL  Her chest xray and TB screening tests were both negative- I have letters with results printed that we will mail to her.

## 2024-04-20 ENCOUNTER — Ambulatory Visit: Admitting: Family Medicine
